# Patient Record
Sex: Female | Born: 1970 | Race: White | Hispanic: No | Marital: Single | Smoking: Never smoker
Health system: Southern US, Community
[De-identification: ages and names within clinical notes are randomized; demographics above are authoritative.]

## PROBLEM LIST (undated history)

## (undated) DIAGNOSIS — I10 Essential (primary) hypertension: Secondary | ICD-10-CM

## (undated) DIAGNOSIS — A4902 Methicillin resistant Staphylococcus aureus infection, unspecified site: Secondary | ICD-10-CM

## (undated) DIAGNOSIS — K219 Gastro-esophageal reflux disease without esophagitis: Secondary | ICD-10-CM

## (undated) HISTORY — PX: LAPAROSCOPIC GASTRIC SLEEVE RESECTION: SHX5895

## (undated) HISTORY — PX: CHOLECYSTECTOMY: SHX55

## (undated) HISTORY — PX: LEG SURGERY: SHX1003

## (undated) HISTORY — DX: Methicillin resistant Staphylococcus aureus infection, unspecified site: A49.02

## (undated) HISTORY — PX: FOOT SURGERY: SHX648

---

## 2002-12-08 ENCOUNTER — Emergency Department (HOSPITAL_COMMUNITY): Admission: EM | Admit: 2002-12-08 | Discharge: 2002-12-08 | Payer: Self-pay | Admitting: Emergency Medicine

## 2002-12-20 ENCOUNTER — Other Ambulatory Visit: Admission: RE | Admit: 2002-12-20 | Discharge: 2002-12-20 | Payer: Self-pay | Admitting: Obstetrics and Gynecology

## 2003-02-06 ENCOUNTER — Other Ambulatory Visit: Admission: RE | Admit: 2003-02-06 | Discharge: 2003-02-06 | Payer: Self-pay | Admitting: Obstetrics & Gynecology

## 2003-03-21 ENCOUNTER — Emergency Department (HOSPITAL_COMMUNITY): Admission: EM | Admit: 2003-03-21 | Discharge: 2003-03-21 | Payer: Self-pay | Admitting: Emergency Medicine

## 2003-05-23 ENCOUNTER — Ambulatory Visit (HOSPITAL_COMMUNITY): Admission: RE | Admit: 2003-05-23 | Discharge: 2003-05-23 | Payer: Self-pay | Admitting: Obstetrics and Gynecology

## 2003-07-09 ENCOUNTER — Ambulatory Visit (HOSPITAL_COMMUNITY): Admission: RE | Admit: 2003-07-09 | Discharge: 2003-07-09 | Payer: Self-pay | Admitting: Obstetrics & Gynecology

## 2003-11-07 ENCOUNTER — Inpatient Hospital Stay (HOSPITAL_COMMUNITY): Admission: AD | Admit: 2003-11-07 | Discharge: 2003-11-07 | Payer: Self-pay | Admitting: Obstetrics and Gynecology

## 2003-11-08 ENCOUNTER — Inpatient Hospital Stay (HOSPITAL_COMMUNITY): Admission: AD | Admit: 2003-11-08 | Discharge: 2003-11-11 | Payer: Self-pay | Admitting: Obstetrics & Gynecology

## 2003-11-08 ENCOUNTER — Encounter (INDEPENDENT_AMBULATORY_CARE_PROVIDER_SITE_OTHER): Payer: Self-pay | Admitting: *Deleted

## 2003-11-26 ENCOUNTER — Inpatient Hospital Stay (HOSPITAL_COMMUNITY): Admission: AD | Admit: 2003-11-26 | Discharge: 2003-11-26 | Payer: Self-pay | Admitting: Obstetrics & Gynecology

## 2003-12-19 ENCOUNTER — Other Ambulatory Visit: Admission: RE | Admit: 2003-12-19 | Discharge: 2003-12-19 | Payer: Self-pay | Admitting: Obstetrics and Gynecology

## 2005-03-25 ENCOUNTER — Emergency Department (HOSPITAL_COMMUNITY): Admission: EM | Admit: 2005-03-25 | Discharge: 2005-03-25 | Payer: Self-pay | Admitting: Emergency Medicine

## 2005-07-26 ENCOUNTER — Emergency Department (HOSPITAL_COMMUNITY): Admission: EM | Admit: 2005-07-26 | Discharge: 2005-07-26 | Payer: Self-pay | Admitting: Family Medicine

## 2007-05-18 ENCOUNTER — Encounter: Admission: RE | Admit: 2007-05-18 | Discharge: 2007-06-13 | Payer: Self-pay | Admitting: Podiatry

## 2007-08-20 ENCOUNTER — Encounter: Admission: RE | Admit: 2007-08-20 | Discharge: 2007-09-13 | Payer: Self-pay | Admitting: Podiatry

## 2008-01-08 ENCOUNTER — Ambulatory Visit (HOSPITAL_COMMUNITY): Admission: RE | Admit: 2008-01-08 | Discharge: 2008-01-08 | Payer: Self-pay | Admitting: Family Medicine

## 2009-05-23 ENCOUNTER — Emergency Department (HOSPITAL_COMMUNITY): Admission: EM | Admit: 2009-05-23 | Discharge: 2009-05-23 | Payer: Self-pay | Admitting: Emergency Medicine

## 2010-06-23 LAB — RAPID STREP SCREEN (MED CTR MEBANE ONLY): Streptococcus, Group A Screen (Direct): NEGATIVE

## 2010-07-01 ENCOUNTER — Emergency Department (HOSPITAL_COMMUNITY)
Admission: EM | Admit: 2010-07-01 | Discharge: 2010-07-01 | Disposition: A | Discharge status: Home / Self Care | Payer: Medicaid Other | Attending: Emergency Medicine | Admitting: Emergency Medicine

## 2010-07-01 DIAGNOSIS — R059 Cough, unspecified: Secondary | ICD-10-CM | POA: Insufficient documentation

## 2010-07-01 DIAGNOSIS — J029 Acute pharyngitis, unspecified: Secondary | ICD-10-CM | POA: Insufficient documentation

## 2010-07-01 DIAGNOSIS — R197 Diarrhea, unspecified: Secondary | ICD-10-CM | POA: Insufficient documentation

## 2010-07-01 DIAGNOSIS — R112 Nausea with vomiting, unspecified: Secondary | ICD-10-CM | POA: Insufficient documentation

## 2010-07-01 DIAGNOSIS — I10 Essential (primary) hypertension: Secondary | ICD-10-CM | POA: Insufficient documentation

## 2010-07-01 DIAGNOSIS — J069 Acute upper respiratory infection, unspecified: Secondary | ICD-10-CM | POA: Insufficient documentation

## 2010-07-01 DIAGNOSIS — R05 Cough: Secondary | ICD-10-CM | POA: Insufficient documentation

## 2010-07-01 DIAGNOSIS — R51 Headache: Secondary | ICD-10-CM | POA: Insufficient documentation

## 2010-08-20 NOTE — Op Note (Signed)
Connie Castaneda, Connie Castaneda                             ACCOUNT NO.:  [id number]   MEDICAL RECORD NO.:  [id number]                   PATIENT TYPE:  INP   LOCATION:  9124                                 FACILITY:  [facility]   PHYSICIAN:  Ilda Mori, M.D.                DATE OF BIRTH:  1971/03/05   DATE OF PROCEDURE:  11/08/2003  DATE OF DISCHARGE:                                 OPERATIVE REPORT   PREOPERATIVE DIAGNOSIS:  Nonreassuring fetal heart rate tracing, possible  placental abruption.   POSTOPERATIVE DIAGNOSIS:  Nonreassuring fetal heart rate tracing, possible  placental abruption.   OPERATION PERFORMED:  Primary transverse low cesarean section.   SURGEON:  Ilda Mori, M.D.   ASSISTANT:  Connie Castaneda, M.D.   ANESTHESIA:  Epidural.   ESTIMATED BLOOD LOSS:  1500 mL.   FINDINGS:  Female infant, birth weight 5 pounds 14 ounces, Apgar scores 7 and  9.  Arterial cord pH 7.25, bloody amniotic fluid consistent with abruption,  small clot at margin of placenta which was sent to pathology.   INDICATIONS FOR PROCEDURE:  The patient is a 40 year old gravida 2, para 0-1-  0-0 whose previous delivery was a vaginal delivery of an encephalic infant  at seven months.  The patient presented to triage with ruptured membranes in  early labor.  She was started on IV Pitocin after an epidural had been  placed and progressed very slowly throughout the day.  Her admission exam  was 5 cm by the nursing triage and this was at 7 o'clock a.m.  At 3 o'clock  p.m. her cervix was still 6 to 7 cm.  The patient developed a vaginal  bleeding which appeared to be greater than a bloody show.  Fetal heart tones  were reactive; however, there were persistent variables starting around 2  p.m. which continued for over an hour and these variables although they were  not severe were consistent with contractions and were atypical in the sense  that there was a slow recovery.  At approximately 4:30 to 5:00,  careful  evaluation of the situation revealed a nonreassuring fetal heart rate  tracing with very slow progress and no sense of immediate delivery and  persistent vaginal bleeding consistent with a small abruption.  It was felt  that with all these conditions, the safest thing at this point was to  proceed with cesarean section.  This was discussed with the patient who  agreed.   DESCRIPTION OF PROCEDURE:  The patient was taken to the operating room and  the epidural catheter that had been placed for labor analgesia was used for  surgical anesthesia.  The abdomen was prepped and draped in sterile fashion.  The bladder had previously been catheterized.  A low transverse incision was  made and carried down to the fascia which was extended transversely.  The  rectus muscle was then dissected free from  the overlying rectus fascia and  then divided in the midline and the peritoneal cavity was entered by blunt  dissection and extended bluntly.  The bladder plane was created sharply.  The bladder was displaced inferiorly digitally and then the lower segment  was then entered and the incision was extended bluntly.  The infant was  delivered with the aid of a vacuum extractor.  The placenta was then  delivered with manual extraction.  The uterus was bluntly curettaged.  The  lower segment was then closed with a single running interlocking layer of 0  Vicryl suture.  The bleeders along the edge of the closure were then  controlled with figure-of-eight and U sutures until hemostasis was obtained.  The bladder plane was not closed.  The fascia and rectus muscle was then  reapposed in the midline.  The fascia was closed with a running 0 Vicryl  suture and because of the patient's greater than 4 cm of adipose tissue, the  subcutaneous layer was closed with interrupted 2-0 plain catgut suture.  The  skin was then closed with staples.  The patient tolerated the procedure well  and left the operating room  in good condition.                                               Ilda Mori, M.D.    RK/MEDQ  D:  11/08/2003  T:  11/10/2003  Job:  [id number]

## 2010-08-20 NOTE — Discharge Summary (Signed)
NAMEJAQUIA, Connie Castaneda                             ACCOUNT NO.:  [id number]   MEDICAL RECORD NO.:  [id number]                   PATIENT TYPE:  INP   LOCATION:  9124                                 FACILITY:  [facility]   PHYSICIAN:  Miguel Aschoff, M.D.                    DATE OF BIRTH:  1970/04/29   DATE OF ADMISSION:  11/08/2003  DATE OF DISCHARGE:  11/11/2003                                 DISCHARGE SUMMARY   FINAL DIAGNOSES:  1.  Intrauterine pregnancy at term.  2.  Spontaneous rupture of membranes.  3.  Nonreassuring fetal heart tracing with possible placental abruption.   PROCEDURE:  Primary low transverse cesarean section.  Surgeon:  Dr. Ilda Mori.  Assistant:  Dr. Corky Sox.  Complications:  None.   This 39 year old G2 P0-1-0-0 presents at term with spontaneous rupture of  membranes and complaining of contractions.  The patient had a previous  delivery at 7 months with an encephalic infant.  The patient's antepartum  course with this pregnancy was also complicated by this history, of course,  of neural tube defect.  She was worked up by [employer] and did have a  normal amniocentesis performed with this pregnancy.  The patient also had a  positive group B strep culture and was started on antibiotics during labor.  Otherwise, the patient's antepartum course had been uncomplicated.  She was  admitted at this time, started on some IV Pitocin.  She progressed slowly  throughout the day.  On admission the patient's cervix was about 5 cm  dilated.  By about 3 p.m. that afternoon her cervix was only about 6-7 cm  dilated and she began to develop some vaginal bleeding which appeared to be  greater than a bloody show.  At this point fetal heart tones were reactive.  However, there were some persistent variables starting in mid afternoon  which continued for over an hour.  At about 4:30 p.m. that day, careful  evaluation of the situation revealed a nonreassuring fetal heart tracing  with slow progress and a decision was made to proceed with a cesarean  section.  The patient was taken to the operating room by Dr. Ilda Mori  where a primary low transverse cesarean section was performed with the  delivery of a 5-pound 14-ounce female infant with Apgars of 7 and 9.  There  was an arterial cord pH of 7.25 and bloody amniotic fluid consistent with  abruption, and a small clot at the margin of placenta was sent to pathology.  The patient's postoperative course was uncomplicated without any fevers.  She was felt ready for discharge on postoperative day #3.  She was sent home  on a regular diet, told to decrease activities, was given a prescription for  Tylox one q.4h. as needed for pain, told to continue her Chromagen iron  supplement, was to follow up  in the office in 4 weeks; of course, to call  with any increased problems, pain, or bleeding.   LABORATORY DATA ON DISCHARGE:  The patient had a hemoglobin of 9.4, white  blood cell count of 11.8.     Connie Castaneda, P.A.-C.                Miguel Aschoff, M.D.    MB/MEDQ  D:  12/15/2003  T:  12/15/2003  Job:  [id number]

## 2010-10-27 ENCOUNTER — Other Ambulatory Visit (HOSPITAL_COMMUNITY): Payer: Self-pay | Admitting: Family Medicine

## 2010-10-27 DIAGNOSIS — Z1231 Encounter for screening mammogram for malignant neoplasm of breast: Secondary | ICD-10-CM

## 2010-11-25 ENCOUNTER — Emergency Department (HOSPITAL_COMMUNITY)
Admission: EM | Admit: 2010-11-25 | Discharge: 2010-11-26 | Disposition: A | Discharge status: Home / Self Care | Payer: Medicaid Other | Attending: Emergency Medicine | Admitting: Emergency Medicine

## 2010-11-25 DIAGNOSIS — T280XXA Burn of mouth and pharynx, initial encounter: Secondary | ICD-10-CM | POA: Insufficient documentation

## 2010-11-25 DIAGNOSIS — A499 Bacterial infection, unspecified: Secondary | ICD-10-CM | POA: Insufficient documentation

## 2010-11-25 DIAGNOSIS — X12XXXA Contact with other hot fluids, initial encounter: Secondary | ICD-10-CM | POA: Insufficient documentation

## 2010-11-25 DIAGNOSIS — B9689 Other specified bacterial agents as the cause of diseases classified elsewhere: Secondary | ICD-10-CM | POA: Insufficient documentation

## 2010-11-25 DIAGNOSIS — N760 Acute vaginitis: Secondary | ICD-10-CM | POA: Insufficient documentation

## 2010-11-25 DIAGNOSIS — I10 Essential (primary) hypertension: Secondary | ICD-10-CM | POA: Insufficient documentation

## 2010-11-26 LAB — URINALYSIS, ROUTINE W REFLEX MICROSCOPIC
Bilirubin Urine: NEGATIVE
Glucose, UA: NEGATIVE mg/dL
Hgb urine dipstick: NEGATIVE
Ketones, ur: NEGATIVE mg/dL
Leukocytes, UA: NEGATIVE
Nitrite: NEGATIVE
Protein, ur: NEGATIVE mg/dL
Specific Gravity, Urine: 1.022 (ref 1.005–1.030)
Urobilinogen, UA: 0.2 mg/dL (ref 0.0–1.0)
pH: 6 (ref 5.0–8.0)

## 2010-11-26 LAB — WET PREP, GENITAL
Trich, Wet Prep: NONE SEEN
Yeast Wet Prep HPF POC: NONE SEEN

## 2010-11-26 LAB — POCT PREGNANCY, URINE: Preg Test, Ur: NEGATIVE

## 2010-11-29 ENCOUNTER — Ambulatory Visit (HOSPITAL_COMMUNITY): Payer: Medicaid Other | Attending: Family Medicine

## 2010-11-30 LAB — GC/CHLAMYDIA PROBE AMP, GENITAL
Chlamydia, DNA Probe: UNDETERMINED
GC Probe Amp, Genital: NEGATIVE

## 2011-01-05 ENCOUNTER — Emergency Department (HOSPITAL_COMMUNITY)
Admission: EM | Admit: 2011-01-05 | Discharge: 2011-01-05 | Disposition: A | Discharge status: Home / Self Care | Payer: Medicaid Other | Attending: Emergency Medicine | Admitting: Emergency Medicine

## 2011-01-05 DIAGNOSIS — J020 Streptococcal pharyngitis: Secondary | ICD-10-CM | POA: Insufficient documentation

## 2011-01-05 DIAGNOSIS — I10 Essential (primary) hypertension: Secondary | ICD-10-CM | POA: Insufficient documentation

## 2011-01-05 DIAGNOSIS — J351 Hypertrophy of tonsils: Secondary | ICD-10-CM | POA: Insufficient documentation

## 2011-01-09 ENCOUNTER — Emergency Department (HOSPITAL_COMMUNITY)
Admission: EM | Admit: 2011-01-09 | Discharge: 2011-01-09 | Disposition: A | Discharge status: Home / Self Care | Payer: Medicaid Other | Attending: Emergency Medicine | Admitting: Emergency Medicine

## 2011-01-09 DIAGNOSIS — R509 Fever, unspecified: Secondary | ICD-10-CM | POA: Insufficient documentation

## 2011-01-09 DIAGNOSIS — J029 Acute pharyngitis, unspecified: Secondary | ICD-10-CM | POA: Insufficient documentation

## 2011-01-09 DIAGNOSIS — B373 Candidiasis of vulva and vagina: Secondary | ICD-10-CM | POA: Insufficient documentation

## 2011-01-09 DIAGNOSIS — R110 Nausea: Secondary | ICD-10-CM | POA: Insufficient documentation

## 2011-01-09 DIAGNOSIS — B3731 Acute candidiasis of vulva and vagina: Secondary | ICD-10-CM | POA: Insufficient documentation

## 2011-01-09 DIAGNOSIS — I10 Essential (primary) hypertension: Secondary | ICD-10-CM | POA: Insufficient documentation

## 2012-11-07 ENCOUNTER — Other Ambulatory Visit: Payer: Self-pay | Admitting: Family Medicine

## 2012-11-07 DIAGNOSIS — Z1231 Encounter for screening mammogram for malignant neoplasm of breast: Secondary | ICD-10-CM

## 2012-11-20 ENCOUNTER — Ambulatory Visit
Admission: RE | Admit: 2012-11-20 | Discharge: 2012-11-20 | Disposition: A | Discharge status: Home / Self Care | Payer: Medicaid Other | Source: Ambulatory Visit | Attending: Family Medicine | Admitting: Family Medicine

## 2012-11-20 DIAGNOSIS — Z1231 Encounter for screening mammogram for malignant neoplasm of breast: Secondary | ICD-10-CM

## 2013-07-08 ENCOUNTER — Emergency Department (HOSPITAL_COMMUNITY)
Admission: EM | Admit: 2013-07-08 | Discharge: 2013-07-08 | Disposition: A | Discharge status: Home / Self Care | Payer: Medicaid Other | Source: Home / Self Care | Attending: Emergency Medicine | Admitting: Emergency Medicine

## 2013-07-08 ENCOUNTER — Encounter (HOSPITAL_COMMUNITY): Payer: Self-pay | Admitting: Emergency Medicine

## 2013-07-08 ENCOUNTER — Emergency Department (INDEPENDENT_AMBULATORY_CARE_PROVIDER_SITE_OTHER): Payer: Medicaid Other

## 2013-07-08 DIAGNOSIS — J40 Bronchitis, not specified as acute or chronic: Secondary | ICD-10-CM

## 2013-07-08 HISTORY — DX: Essential (primary) hypertension: I10

## 2013-07-08 MED ORDER — GUAIFENESIN-CODEINE 100-10 MG/5ML PO SOLN: 10.0000 mL | ORAL | Status: DC | PRN

## 2013-07-08 MED ORDER — IPRATROPIUM BROMIDE 0.02 % IN SOLN
RESPIRATORY_TRACT | Status: AC
  Filled 2013-07-08: qty 2.5

## 2013-07-08 MED ORDER — ALBUTEROL SULFATE HFA 108 (90 BASE) MCG/ACT IN AERS: 2.0000 | INHALATION_SPRAY | RESPIRATORY_TRACT | Status: DC | PRN

## 2013-07-08 MED ORDER — ALBUTEROL SULFATE (2.5 MG/3ML) 0.083% IN NEBU
INHALATION_SOLUTION | RESPIRATORY_TRACT | Status: AC
  Filled 2013-07-08: qty 3

## 2013-07-08 MED ORDER — AEROCHAMBER PLUS FLO-VU LARGE MISC: 1.0000 | Freq: Once | Status: DC

## 2013-07-08 MED ORDER — IPRATROPIUM-ALBUTEROL 0.5-2.5 (3) MG/3ML IN SOLN
3.0000 mL | Freq: Once | RESPIRATORY_TRACT | Status: AC
  Administered 2013-07-08: 3 mL via RESPIRATORY_TRACT

## 2013-07-08 MED ORDER — METHYLPREDNISOLONE 4 MG PO KIT: PACK | ORAL | Status: DC

## 2013-07-08 NOTE — ED Notes (Signed)
C/o   Nonproductive cough with chest congestion.  Chest soreness.  Sweats.  Chills.  Fever.  And nausea.  On set Thursday.  No relief with otc meds.

## 2013-07-08 NOTE — ED Provider Notes (Signed)
CSN: [id number]     Arrival date & time 07/08/13  4098 History   None    Chief Complaint  Patient presents with  . URI   (Consider location/radiation/quality/duration/timing/severity/associated sxs/prior Treatment) HPI Comments: 43 year old female presents complaining of being sick for 5 days. She has cough, body aches, fever, chills, nausea, sinus pressure, nasal congestion, rhinorrhea. Her symptoms began with just a mild cough. All the other symptoms have evolved since then and have gotten progressively worse. The body aches started yesterday and the nausea started this morning, although she did not actually vomit. She is no longer feeling nauseous. She has been taking over-the-counter medications with very minimal relief of her symptoms, she feels like she just continues to get worse.  She denies chest pain, shortness of breath, pleuritic chest pain, abdominal pain, sore throat. No recent travel or sick contacts.   Patient is a 43 y.o. female presenting with URI.  URI Presenting symptoms: congestion, cough, fatigue, fever and rhinorrhea   Presenting symptoms: no sore throat   Associated symptoms: myalgias   Associated symptoms: no wheezing     Past Medical History  Diagnosis Date  . Hypertension    Past Surgical History  Procedure Laterality Date  . Cholecystectomy    . Foot surgery    . Cesarean section     History reviewed. No pertinent family history. History  Substance Use Topics  . Smoking status: Never Smoker   . Smokeless tobacco: Not on file  . Alcohol Use: No   OB History   Grav Para Term Preterm Abortions TAB SAB Ect Mult Living                 Review of Systems  Constitutional: Positive for fever, chills and fatigue.  HENT: Positive for congestion, rhinorrhea and sinus pressure. Negative for sore throat.   Respiratory: Positive for cough and chest tightness. Negative for shortness of breath and wheezing.   Cardiovascular: Negative for chest pain and leg  swelling.  Gastrointestinal: Positive for nausea. Negative for vomiting, abdominal pain and diarrhea.  Musculoskeletal: Positive for myalgias.    Allergies  Review of patient's allergies indicates no known allergies.  Home Medications   Current Outpatient Rx  Name  Route  Sig  Dispense  Refill  . amLODipine-benazepril (LOTREL) 10-40 MG per capsule   Oral   Take 1 capsule by mouth daily.         Marland Kitchen omeprazole (PRILOSEC) 40 MG capsule   Oral   Take 40 mg by mouth daily.         . sucralfate (CARAFATE) 1 G tablet   Oral   Take 1 g by mouth 4 (four) times daily -  with meals and at bedtime.         Marland Kitchen albuterol (PROVENTIL HFA;VENTOLIN HFA) 108 (90 BASE) MCG/ACT inhaler   Inhalation   Inhale 2 puffs into the lungs every 4 (four) hours as needed for wheezing.   1 Inhaler   0   . guaiFENesin-codeine 100-10 MG/5ML syrup   Oral   Take 10 mLs by mouth every 4 (four) hours as needed for cough.   180 mL   0   . methylPREDNISolone (MEDROL DOSEPAK) 4 MG tablet      Use as directed on package instructions   21 tablet   0   . Spacer/Aero-Holding Chambers (AEROCHAMBER PLUS FLO-VU LARGE) MISC   Other   1 each by Other route once.   1 each   0  BP 148/62  Pulse 112  Temp(Src) 98.1 F (36.7 C) (Oral)  Resp 20  SpO2 100%  LMP 07/04/2013 Physical Exam  Nursing note and vitals reviewed. Constitutional: She is oriented to person, place, and time. Vital signs are normal. She appears well-developed and well-nourished. No distress.  HENT:  Head: Normocephalic and atraumatic.  Right Ear: External ear normal.  Left Ear: External ear normal.  Nose: Nose normal.  Mouth/Throat: Oropharynx is clear and moist. No oropharyngeal exudate.  Eyes: Conjunctivae are normal. Right eye exhibits no discharge. Left eye exhibits no discharge.  Neck: Normal range of motion. Neck supple.  Cardiovascular: Regular rhythm, normal heart sounds and normal pulses.  Tachycardia present.    Pulmonary/Chest: Effort normal. No accessory muscle usage. Not tachypneic. No respiratory distress. She has no decreased breath sounds. She has wheezes in the left upper field. She has no rhonchi. She has no rales.  Lymphadenopathy:    She has no cervical adenopathy.  Neurological: She is alert and oriented to person, place, and time. She has normal strength. Coordination normal.  Skin: Skin is warm and dry. No rash noted. She is not diaphoretic.  Psychiatric: She has a normal mood and affect. Judgment normal.    ED Course  Procedures (including critical care time) Labs Review Labs Reviewed - No data to display Imaging Review Dg Chest 2 View  07/08/2013   CLINICAL DATA:  Cough, chills and body aches for 5 days.  EXAM: CHEST  2 VIEW  COMPARISON:  None.  FINDINGS: Lungs clear. Heart size normal. No pneumothorax or pleural effusion.  IMPRESSION: Negative chest.   Electronically Signed   By: Drusilla Kannerhomas  Dalessio M.D.   On: 07/08/2013 11:54     MDM   1. Bronchitis    Chest x-ray normal. Improved symptomatically and to auscultation with DuoNeb. Treating with cough suppressant, Medrol Dosepak, albuterol inhaler. Followup if no improvement within 4-5 days or if worsening.   New Prescriptions   ALBUTEROL (PROVENTIL HFA;VENTOLIN HFA) 108 (90 BASE) MCG/ACT INHALER    Inhale 2 puffs into the lungs every 4 (four) hours as needed for wheezing.   GUAIFENESIN-CODEINE 100-10 MG/5ML SYRUP    Take 10 mLs by mouth every 4 (four) hours as needed for cough.   METHYLPREDNISOLONE (MEDROL DOSEPAK) 4 MG TABLET    Use as directed on package instructions   SPACER/AERO-HOLDING CHAMBERS (AEROCHAMBER PLUS FLO-VU LARGE) MISC    1 each by Other route once.       Graylon GoodZachary H Cyanne Delmar, PA-C 07/08/13 1226

## 2013-07-08 NOTE — Discharge Instructions (Signed)

## 2013-07-08 NOTE — ED Provider Notes (Signed)
Medical screening examination/treatment/procedure(s) were performed by non-physician practitioner and as supervising physician I was immediately available for consultation/collaboration.  Leslee Homeavid Drystan Reader, M.D.  Reuben Likesavid C Kassim Guertin, MD 07/08/13 [phone]

## 2013-07-13 ENCOUNTER — Encounter (HOSPITAL_COMMUNITY): Payer: Self-pay | Admitting: Emergency Medicine

## 2013-07-13 ENCOUNTER — Emergency Department (INDEPENDENT_AMBULATORY_CARE_PROVIDER_SITE_OTHER)
Admission: EM | Admit: 2013-07-13 | Discharge: 2013-07-13 | Disposition: A | Discharge status: Home / Self Care | Payer: Medicaid Other | Source: Home / Self Care | Attending: Family Medicine | Admitting: Family Medicine

## 2013-07-13 DIAGNOSIS — R1013 Epigastric pain: Secondary | ICD-10-CM

## 2013-07-13 NOTE — Discharge Instructions (Signed)
Thank you for coming in today. Resume your  Sucralfate and omeprazole.  Followup with your primary care Dr. Soon If not better please go to the emergency room or return.  If you get dramatically worse go to the emergency room If your belly pain worsens, or you have high fever, bad vomiting, blood in your stool or black tarry stool go to the Emergency Room.   Abdominal Pain, Women Abdominal (stomach, pelvic, or belly) pain can be caused by many things. It is important to tell your doctor:  The location of the pain.  Does it come and go or is it present all the time?  Are there things that start the pain (eating certain foods, exercise)?  Are there other symptoms associated with the pain (fever, nausea, vomiting, diarrhea)? All of this is helpful to know when trying to find the cause of the pain. CAUSES   Stomach: virus or bacteria infection, or ulcer.  Intestine: appendicitis (inflamed appendix), regional ileitis (Crohn's disease), ulcerative colitis (inflamed colon), irritable bowel syndrome, diverticulitis (inflamed diverticulum of the colon), or cancer of the stomach or intestine.  Gallbladder disease or stones in the gallbladder.  Kidney disease, kidney stones, or infection.  Pancreas infection or cancer.  Fibromyalgia (pain disorder).  Diseases of the female organs:  Uterus: fibroid (non-cancerous) tumors or infection.  Fallopian tubes: infection or tubal pregnancy.  Ovary: cysts or tumors.  Pelvic adhesions (scar tissue).  Endometriosis (uterus lining tissue growing in the pelvis and on the pelvic organs).  Pelvic congestion syndrome (female organs filling up with blood just before the menstrual period).  Pain with the menstrual period.  Pain with ovulation (producing an egg).  Pain with an IUD (intrauterine device, birth control) in the uterus.  Cancer of the female organs.  Functional pain (pain not caused by a disease, may improve without  treatment).  Psychological pain.  Depression. DIAGNOSIS  Your doctor will decide the seriousness of your pain by doing an examination.  Blood tests.  X-rays.  Ultrasound.  CT scan (computed tomography, special type of X-ray).  MRI (magnetic resonance imaging).  Cultures, for infection.  Barium enema (dye inserted in the large intestine, to better view it with X-rays).  Colonoscopy (looking in intestine with a lighted tube).  Laparoscopy (minor surgery, looking in abdomen with a lighted tube).  Major abdominal exploratory surgery (looking in abdomen with a large incision). TREATMENT  The treatment will depend on the cause of the pain.   Many cases can be observed and treated at home.  Over-the-counter medicines recommended by your caregiver.  Prescription medicine.  Antibiotics, for infection.  Birth control pills, for painful periods or for ovulation pain.  Hormone treatment, for endometriosis.  Nerve blocking injections.  Physical therapy.  Antidepressants.  Counseling with a psychologist or psychiatrist.  Minor or major surgery. HOME CARE INSTRUCTIONS   Do not take laxatives, unless directed by your caregiver.  Take over-the-counter pain medicine only if ordered by your caregiver. Do not take aspirin because it can cause an upset stomach or bleeding.  Try a clear liquid diet (broth or water) as ordered by your caregiver. Slowly move to a bland diet, as tolerated, if the pain is related to the stomach or intestine.  Have a thermometer and take your temperature several times a day, and record it.  Bed rest and sleep, if it helps the pain.  Avoid sexual intercourse, if it causes pain.  Avoid stressful situations.  Keep your follow-up appointments and tests, as your  caregiver orders.  If the pain does not go away with medicine or surgery, you may try:  Acupuncture.  Relaxation exercises (yoga, meditation).  Group therapy.  Counseling. SEEK  MEDICAL CARE IF:   You notice certain foods cause stomach pain.  Your home care treatment is not helping your pain.  You need stronger pain medicine.  You want your IUD removed.  You feel faint or lightheaded.  You develop nausea and vomiting.  You develop a rash.  You are having side effects or an allergy to your medicine. SEEK IMMEDIATE MEDICAL CARE IF:   Your pain does not go away or gets worse.  You have a fever.  Your pain is felt only in portions of the abdomen. The right side could possibly be appendicitis. The left lower portion of the abdomen could be colitis or diverticulitis.  You are passing blood in your stools (bright red or black tarry stools, with or without vomiting).  You have blood in your urine.  You develop chills, with or without a fever.  You pass out. MAKE SURE YOU:   Understand these instructions.  Will watch your condition.  Will get help right away if you are not doing well or get worse. Document Released: 01/16/2007 Document Revised: 06/13/2011 Document Reviewed: 02/05/2009 [facility] Patient Information 2014 [place], [place].

## 2013-07-13 NOTE — ED Notes (Addendum)
Pt  Reports  Upper  abd  Pain     That  She  Reports  She  Has  Had  fot  Quite  A  While  That  Became  Worse  Today   denys  Any  Vomiting  But  Reports  Some  Nausea   Present  Pt  Reports  Has  Been  Seen in past  For  chostochondritis    Pt  Seen  ucc  5  Days  Ago

## 2013-07-13 NOTE — ED Provider Notes (Signed)
Connie Castaneda is a 43 y.o. female who presents to Urgent Care today for epigastric abdominal pain. Patient has a history of epigastric abdominal pain diagnosed as a stomach ulcer by her primary care provider. She was treated with Carafate and omeprazole which worked well. She was seen 5 days ago for bronchitis and given prednisone. She stopped taking but the Carafate and omeprazole during treatment of her bronchitis. Her symptoms worsened over the last several days. She denies any vomiting or diarrhea. The pain is worse today but improved with ibuprofen. She denies any blood in her stool or black her stool. She feels well otherwise. Pain is moderate.   Past Medical History  Diagnosis Date  . Hypertension    History  Substance Use Topics  . Smoking status: Never Smoker   . Smokeless tobacco: Not on file  . Alcohol Use: No   ROS as above Medications: No current facility-administered medications for this encounter.   Current Outpatient Prescriptions  Medication Sig Dispense Refill  . albuterol (PROVENTIL HFA;VENTOLIN HFA) 108 (90 BASE) MCG/ACT inhaler Inhale 2 puffs into the lungs every 4 (four) hours as needed for wheezing.  1 Inhaler  0  . amLODipine-benazepril (LOTREL) 10-40 MG per capsule Take 1 capsule by mouth daily.      Marland Kitchen. guaiFENesin-codeine 100-10 MG/5ML syrup Take 10 mLs by mouth every 4 (four) hours as needed for cough.  180 mL  0  . methylPREDNISolone (MEDROL DOSEPAK) 4 MG tablet Use as directed on package instructions  21 tablet  0  . omeprazole (PRILOSEC) 40 MG capsule Take 40 mg by mouth daily.      Marland Kitchen. Spacer/Aero-Holding Chambers (AEROCHAMBER PLUS FLO-VU LARGE) MISC 1 each by Other route once.  1 each  0  . sucralfate (CARAFATE) 1 G tablet Take 1 g by mouth 4 (four) times daily -  with meals and at bedtime.        Exam:  BP 154/94  Pulse 103  Temp(Src) 97.9 F (36.6 C) (Oral)  Resp 22  SpO2 97%  LMP 07/04/2013 Gen: Well NAD HEENT: EOMI,  MMM Lungs: Normal work of  breathing. CTABL Heart: RRR no MRG Abd: NABS, Soft. NT, ND Exts: Brisk capillary refill, warm and well perfused.   No results found for this or any previous visit (from the past 24 hour(s)). No results found.  Assessment and Plan: 43 y.o. female with probable gastritis. Patient does not have much pain currently. She stopped taking the medication she was taking for gastritis in her symptoms worsened. Plan to restart with Carafate and omeprazole. Followup with primary care provider. Not improving recommend followup with gastroenterology. Present to ER if worsening.   Discussed warning signs or symptoms. Please see discharge instructions. Patient expresses understanding.    Rodolph BongEvan S Mayla Biddy, MD 07/13/13 2012

## 2013-07-16 ENCOUNTER — Encounter (HOSPITAL_COMMUNITY): Payer: Self-pay | Admitting: Emergency Medicine

## 2013-07-16 ENCOUNTER — Emergency Department (HOSPITAL_COMMUNITY)
Admission: EM | Admit: 2013-07-16 | Discharge: 2013-07-16 | Disposition: A | Discharge status: Home / Self Care | Payer: Medicaid Other | Attending: Emergency Medicine | Admitting: Emergency Medicine

## 2013-07-16 ENCOUNTER — Emergency Department (HOSPITAL_COMMUNITY): Payer: Medicaid Other

## 2013-07-16 DIAGNOSIS — R059 Cough, unspecified: Secondary | ICD-10-CM | POA: Insufficient documentation

## 2013-07-16 DIAGNOSIS — R109 Unspecified abdominal pain: Secondary | ICD-10-CM

## 2013-07-16 DIAGNOSIS — Z79899 Other long term (current) drug therapy: Secondary | ICD-10-CM | POA: Insufficient documentation

## 2013-07-16 DIAGNOSIS — R05 Cough: Secondary | ICD-10-CM | POA: Insufficient documentation

## 2013-07-16 DIAGNOSIS — R000 Tachycardia, unspecified: Secondary | ICD-10-CM | POA: Insufficient documentation

## 2013-07-16 DIAGNOSIS — Z3202 Encounter for pregnancy test, result negative: Secondary | ICD-10-CM | POA: Insufficient documentation

## 2013-07-16 DIAGNOSIS — I10 Essential (primary) hypertension: Secondary | ICD-10-CM | POA: Insufficient documentation

## 2013-07-16 DIAGNOSIS — K219 Gastro-esophageal reflux disease without esophagitis: Secondary | ICD-10-CM | POA: Insufficient documentation

## 2013-07-16 HISTORY — DX: Gastro-esophageal reflux disease without esophagitis: K21.9

## 2013-07-16 LAB — COMPREHENSIVE METABOLIC PANEL
ALT: 17 U/L (ref 0–35)
AST: 19 U/L (ref 0–37)
Albumin: 3.3 g/dL — ABNORMAL LOW (ref 3.5–5.2)
Alkaline Phosphatase: 67 U/L (ref 39–117)
BUN: 14 mg/dL (ref 6–23)
CO2: 27 mEq/L (ref 19–32)
Calcium: 9.4 mg/dL (ref 8.4–10.5)
Chloride: 98 mEq/L (ref 96–112)
Creatinine, Ser: 0.71 mg/dL (ref 0.50–1.10)
GFR calc Af Amer: >90 mL/min (ref >90)
GFR calc non Af Amer: >90 mL/min (ref >90)
Glucose, Bld: 103 mg/dL — ABNORMAL HIGH (ref 70–99)
Potassium: 4 mEq/L (ref 3.7–5.3)
Sodium: 137 mEq/L (ref 137–147)
Total Bilirubin: 0.3 mg/dL (ref 0.3–1.2)
Total Protein: 7.7 g/dL (ref 6.0–8.3)

## 2013-07-16 LAB — URINALYSIS, ROUTINE W REFLEX MICROSCOPIC
Bilirubin Urine: NEGATIVE
Glucose, UA: NEGATIVE mg/dL
Hgb urine dipstick: NEGATIVE
Ketones, ur: NEGATIVE mg/dL
Leukocytes, UA: NEGATIVE
Nitrite: NEGATIVE
Protein, ur: NEGATIVE mg/dL
Specific Gravity, Urine: 1.02 (ref 1.005–1.030)
Urobilinogen, UA: 0.2 mg/dL (ref 0.0–1.0)
pH: 6 (ref 5.0–8.0)

## 2013-07-16 LAB — CBC
HCT: 38.4 % (ref 36.0–46.0)
Hemoglobin: 12.6 g/dL (ref 12.0–15.0)
MCH: 28.5 pg (ref 26.0–34.0)
MCHC: 32.8 g/dL (ref 30.0–36.0)
MCV: 86.9 fL (ref 78.0–100.0)
Platelets: 352 10*3/uL (ref 150–400)
RBC: 4.42 MIL/uL (ref 3.87–5.11)
RDW: 12.3 % (ref 11.5–15.5)
WBC: 9.5 10*3/uL (ref 4.0–10.5)

## 2013-07-16 LAB — LIPASE, BLOOD: Lipase: 37 U/L (ref 11–59)

## 2013-07-16 LAB — D-DIMER, QUANTITATIVE: D-Dimer, Quant: <0.27 ug/mL-FEU (ref 0.00–0.48)

## 2013-07-16 LAB — PREGNANCY, URINE: Preg Test, Ur: NEGATIVE

## 2013-07-16 MED ORDER — TRAMADOL HCL 50 MG PO TABS: 50.0000 mg | ORAL_TABLET | Freq: Four times a day (QID) | ORAL | Status: DC | PRN

## 2013-07-16 MED ORDER — KETOROLAC TROMETHAMINE 30 MG/ML IJ SOLN
30.0000 mg | Freq: Once | INTRAMUSCULAR | Status: AC
  Administered 2013-07-16: 30 mg via INTRAMUSCULAR
  Filled 2013-07-16: qty 1

## 2013-07-16 NOTE — Discharge Instructions (Signed)
Take motrin or aleve as need for pain. You may also take ultram as need for pain - no driving if/when taking ultram. Follow up with primary care doctor in 1 week if symptoms fail to improve/resolve. Return to ER if worse, new symptoms, fevers, trouble breathing, severe abdominal pain, persistent vomiting, other concern.     Abdominal Pain, Adult Many things can cause abdominal pain. Usually, abdominal pain is not caused by a disease and will improve without treatment. It can often be observed and treated at home. Your health care provider will do a physical exam and possibly order blood tests and X-rays to help determine the seriousness of your pain. However, in many cases, more time must pass before a clear cause of the pain can be found. Before that point, your health care provider may not know if you need more testing or further treatment. HOME CARE INSTRUCTIONS  Monitor your abdominal pain for any changes. The following actions may help to alleviate any discomfort you are experiencing:  Only take over-the-counter or prescription medicines as directed by your health care provider.  Do not take laxatives unless directed to do so by your health care provider.  Try a clear liquid diet (broth, tea, or water) as directed by your health care provider. Slowly move to a bland diet as tolerated. SEEK MEDICAL CARE IF:  You have unexplained abdominal pain.  You have abdominal pain associated with nausea or diarrhea.  You have pain when you urinate or have a bowel movement.  You experience abdominal pain that wakes you in the night.  You have abdominal pain that is worsened or improved by eating food.  You have abdominal pain that is worsened with eating fatty foods. SEEK IMMEDIATE MEDICAL CARE IF:   Your pain does not go away within 2 hours.  You have a fever.  You keep throwing up (vomiting).  Your pain is felt only in portions of the abdomen, such as the right side or the left lower  portion of the abdomen.  You pass bloody or black tarry stools. MAKE SURE YOU:  Understand these instructions.   Will watch your condition.   Will get help right away if you are not doing well or get worse.  Document Released: 12/29/2004 Document Revised: 01/09/2013 Document Reviewed: 11/28/2012 [facility] Patient Information 2014 [place], [place].     Flank Pain Flank pain refers to pain that is located on the side of the body between the upper abdomen and the back. The pain may occur over a short period of time (acute) or may be long-term or reoccurring (chronic). It may be mild or severe. Flank pain can be caused by many things. CAUSES  Some of the more common causes of flank pain include:  Muscle strains.   Muscle spasms.   A disease of your spine (vertebral disk disease).   A lung infection (pneumonia).   Fluid around your lungs (pulmonary edema).   A kidney infection.   Kidney stones.   A very painful skin rash caused by the chickenpox virus (shingles).   Gallbladder disease.  HOME CARE INSTRUCTIONS  Home care will depend on the cause of your pain. In general,  Rest as directed by your caregiver.  Drink enough fluids to keep your urine clear or pale yellow.  Only take over-the-counter or prescription medicines as directed by your caregiver. Some medicines may help relieve the pain.  Tell your caregiver about any changes in your pain.  Follow up with your caregiver as  directed. SEEK IMMEDIATE MEDICAL CARE IF:   Your pain is not controlled with medicine.   You have new or worsening symptoms.  Your pain increases.   You have abdominal pain.   You have shortness of breath.   You have persistent nausea or vomiting.   You have swelling in your abdomen.   You feel faint or pass out.   You have blood in your urine.  You have a fever or persistent symptoms for more than 2 3 days.  You have a fever and your symptoms suddenly get  worse. MAKE SURE YOU:   Understand these instructions.  Will watch your condition.  Will get help right away if you are not doing well or get worse. Document Released: 05/12/2005 Document Revised: 12/14/2011 Document Reviewed: 11/03/2011 [facility]Exit[facility] Patient Information 2014 [place], [place].    Cough, Adult  A cough is a reflex that helps clear your throat and airways. It can help heal the body or may be a reaction to an irritated airway. A cough may only last 2 or 3 weeks (acute) or may last more than 8 weeks (chronic).  CAUSES Acute cough:  Viral or bacterial infections. Chronic cough:  Infections.  Allergies.  Asthma.  Post-nasal drip.  Smoking.  Heartburn or acid reflux.  Some medicines.  Chronic lung problems (COPD).  Cancer. SYMPTOMS   Cough.  Fever.  Chest pain.  Increased breathing rate.  High-pitched whistling sound when breathing (wheezing).  Colored mucus that you cough up (sputum). TREATMENT   A bacterial cough may be treated with antibiotic medicine.  A viral cough must run its course and will not respond to antibiotics.  Your caregiver may recommend other treatments if you have a chronic cough. HOME CARE INSTRUCTIONS   Only take over-the-counter or prescription medicines for pain, discomfort, or fever as directed by your caregiver. Use cough suppressants only as directed by your caregiver.  Use a cold steam vaporizer or humidifier in your bedroom or home to help loosen secretions.  Sleep in a semi-upright position if your cough is worse at night.  Rest as needed.  Stop smoking if you smoke. SEEK IMMEDIATE MEDICAL CARE IF:   You have pus in your sputum.  Your cough starts to worsen.  You cannot control your cough with suppressants and are losing sleep.  You begin coughing up blood.  You have difficulty breathing.  You develop pain which is getting worse or is uncontrolled with medicine.  You have a fever. MAKE SURE YOU:    Understand these instructions.  Will watch your condition.  Will get help right away if you are not doing well or get worse. Document Released: 09/17/2010 Document Revised: 06/13/2011 Document Reviewed: 09/17/2010 [facility]Exit[facility] Patient Information 2014 [place], [place].

## 2013-07-16 NOTE — ED Notes (Signed)
MD at bedside. 

## 2013-07-16 NOTE — ED Notes (Signed)
Patient transported to X-ray 

## 2013-07-16 NOTE — ED Notes (Addendum)
Pt c/o of burning epigastric sensation since Sunday. Ongoing Since March with some relief. Seen PCP and has RX for this pain but is not controlling pain. C/o of nausea without vomiting denies fever

## 2013-07-16 NOTE — ED Provider Notes (Signed)
CSN: [id number]     Arrival date & time 07/16/13  [id number] History   First MD Initiated Contact with Patient 07/16/13 [phone]     Chief Complaint  Patient presents with  . Abdominal Pain     (Consider location/radiation/quality/duration/timing/severity/associated sxs/prior Treatment) Patient is a 43 y.o. female presenting with abdominal pain. The history is provided by the patient.  Abdominal Pain Associated symptoms: cough   Associated symptoms: no chest pain, no chills, no constipation, no diarrhea, no dysuria, no fever, no hematuria, no nausea, no shortness of breath, no sore throat and no vomiting   pt c/o right upper abdominal pain for the past few weeks, constant, dull to sharp, mod-severe. No consistent exacerbating or alleviating factors but occasionally worse w certain positional changes, movements, walking, cough, and deep breath. No hx same pain. Remote hx cholecystectomy. No nv. No fever or chills. No abd distension, having normal bms. No mid to lower abd pain. No dysuria or hematuria. No trauma or fall. Not working, denies any heavy lifting, bending or strain to area. States saw pcp last week for same, no definite dx. No immobility, trauma, recent surgery, tob use, ocp,  or prolonged travel. No leg pain or swelling. No hx dvt or pe. 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 Has had recent non prod cough, but improved from prior. No sore throat/runny nose or other uri c/o.     Past Medical History  Diagnosis Date  . Hypertension   . GERD (gastroesophageal reflux disease)    Past Surgical History  Procedure Laterality Date  . Cholecystectomy    . Foot surgery    . Cesarean section     No family history on file. History  Substance Use Topics  . Smoking status: Never Smoker   . Smokeless tobacco: Not on file  . Alcohol Use: No   OB History   Grav Para Term Preterm Abortions TAB SAB Ect Mult Living                 Review of Systems  Constitutional:  Negative for fever and chills.  HENT: Negative for sore throat.   Eyes: Negative for redness.  Respiratory: Positive for cough. Negative for shortness of breath.   Cardiovascular: Negative for chest pain and leg swelling.  Gastrointestinal: Positive for abdominal pain. Negative for nausea, vomiting, diarrhea, constipation and abdominal distention.  Genitourinary: Negative for dysuria, hematuria and flank pain.  Musculoskeletal: Negative for back pain and neck pain.  Skin: Negative for rash.  Neurological: Negative for headaches.  Hematological: Does not bruise/bleed easily.  Psychiatric/Behavioral: Negative for confusion.      Allergies  Review of patient's allergies indicates no known allergies.  Home Medications   Prior to Admission medications   Medication Sig Start Date End Date Taking? Authorizing Provider  albuterol (PROVENTIL HFA;VENTOLIN HFA) 108 (90 BASE) MCG/ACT inhaler Inhale 2 puffs into the lungs every 4 (four) hours as needed for wheezing. 07/08/13   Adrian BlackwaterZachary H Baker, PA-C  amLODipine-benazepril (LOTREL) 10-40 MG per capsule Take 1 capsule by mouth daily.    Historical Provider, MD  guaiFENesin-codeine 100-10 MG/5ML syrup Take 10 mLs by mouth every 4 (four) hours as needed for cough. 07/08/13   Graylon GoodZachary H Baker, PA-C  methylPREDNISolone (MEDROL DOSEPAK) 4 MG tablet Use as directed on package instructions 07/08/13   Graylon GoodZachary H Baker, PA-C  omeprazole (PRILOSEC) 40 MG capsule Take 40 mg by mouth daily.    Historical Provider, MD  Spacer/Aero-Holding Chambers (AEROCHAMBER PLUS FLO-VU  LARGE) MISC 1 each by Other route once. 07/08/13   Adrian Blackwater Baker, PA-C  sucralfate (CARAFATE) 1 G tablet Take 1 g by mouth 4 (four) times daily -  with meals and at bedtime.    Historical Provider, MD   BP 148/94  Pulse 98  Temp(Src) 97.7 F (36.5 C) (Oral)  Resp 20  SpO2 99%  LMP 07/04/2013 Physical Exam  Nursing note and vitals reviewed. Constitutional: She is oriented to person, place, and  time. She appears well-developed and well-nourished. No distress.  HENT:  Mouth/Throat: Oropharynx is clear and moist.  Eyes: Conjunctivae are normal. No scleral icterus.  Neck: Neck supple. No tracheal deviation present.  Cardiovascular: Regular rhythm, normal heart sounds and intact distal pulses.  Exam reveals no gallop and no friction rub.   No murmur heard. Tachycardic.   Pulmonary/Chest: Effort normal and breath sounds normal. No respiratory distress.  Abdominal: Soft. Normal appearance and bowel sounds are normal. She exhibits no distension and no mass. There is no tenderness. There is no rebound and no guarding.  Genitourinary:  No cva tenderness  Musculoskeletal: She exhibits no edema and no tenderness.  Thoracic/lumbar spine non tender.   Neurological: She is alert and oriented to person, place, and time.  Steady gait.  Skin: Skin is warm and dry. No rash noted.  No rash/shingles to area of pain  Psychiatric: She has a normal mood and affect.    ED Course  Procedures (including critical care time)  Results for orders placed during the hospital encounter of 07/16/13  CBC      Result Value Ref Range   WBC 9.5  4.0 - 10.5 K/uL   RBC 4.42  3.87 - 5.11 MIL/uL   Hemoglobin 12.6  12.0 - 15.0 g/dL   HCT 16.1  09.6 - 04.5 %   MCV 86.9  78.0 - 100.0 fL   MCH 28.5  26.0 - 34.0 pg   MCHC 32.8  30.0 - 36.0 g/dL   RDW 40.9  81.1 - 91.4 %   Platelets 352  150 - 400 K/uL  COMPREHENSIVE METABOLIC PANEL      Result Value Ref Range   Sodium 137  137 - 147 mEq/L   Potassium 4.0  3.7 - 5.3 mEq/L   Chloride 98  96 - 112 mEq/L   CO2 27  19 - 32 mEq/L   Glucose, Bld 103 (*) 70 - 99 mg/dL   BUN 14  6 - 23 mg/dL   Creatinine, Ser 7.82  0.50 - 1.10 mg/dL   Calcium 9.4  8.4 - 95.6 mg/dL   Total Protein 7.7  6.0 - 8.3 g/dL   Albumin 3.3 (*) 3.5 - 5.2 g/dL   AST 19  0 - 37 U/L   ALT 17  0 - 35 U/L   Alkaline Phosphatase 67  39 - 117 U/L   Total Bilirubin 0.3  0.3 - 1.2 mg/dL   GFR  calc non Af Amer >90  >90 mL/min   GFR calc Af Amer >90  >90 mL/min  LIPASE, BLOOD      Result Value Ref Range   Lipase 37  11 - 59 U/L  URINALYSIS, ROUTINE W REFLEX MICROSCOPIC      Result Value Ref Range   Color, Urine YELLOW  YELLOW   APPearance CLEAR  CLEAR   Specific Gravity, Urine 1.020  1.005 - 1.030   pH 6.0  5.0 - 8.0   Glucose, UA NEGATIVE  NEGATIVE mg/dL  Hgb urine dipstick NEGATIVE  NEGATIVE   Bilirubin Urine NEGATIVE  NEGATIVE   Ketones, ur NEGATIVE  NEGATIVE mg/dL   Protein, ur NEGATIVE  NEGATIVE mg/dL   Urobilinogen, UA 0.2  0.0 - 1.0 mg/dL   Nitrite NEGATIVE  NEGATIVE   Leukocytes, UA NEGATIVE  NEGATIVE  D-DIMER, QUANTITATIVE      Result Value Ref Range   D-Dimer, Quant <0.27  0.00 - 0.48 ug/mL-FEU  PREGNANCY, URINE      Result Value Ref Range   Preg Test, Ur NEGATIVE  NEGATIVE   Dg Chest 2 View  07/16/2013   CLINICAL DATA:  Cough, right chest pain  EXAM: CHEST  2 VIEW  COMPARISON:  07/08/2013  FINDINGS: Low lung volumes noted accentuating the heart size and vascularity. Minor associated basilar atelectasis. No focal pneumonia, collapse or consolidation. Negative for edema, effusion or pneumothorax. Trachea is midline. Prior cholecystectomy noted. Minor thoracic spondylosis evident diffusely.  IMPRESSION: Low volume exam without acute process.   Electronically Signed   By: Ruel Favorsrevor  Shick M.D.   On: 07/16/2013 08:24   Dg Chest 2 View  07/08/2013   CLINICAL DATA:  Cough, chills and body aches for 5 days.  EXAM: CHEST  2 VIEW  COMPARISON:  None.  FINDINGS: Lungs clear. Heart size normal. No pneumothorax or pleural effusion.  IMPRESSION: Negative chest.   Electronically Signed   By: Drusilla Kannerhomas  Dalessio M.D.   On: 07/08/2013 11:54      MDM  Iv ns. Labs.  Reviewed nursing notes and prior charts for additional history.   ddimer and other labs unremarkable for acute process.  Chest xr no pna.   pts pain on recheck appears most likely musculoskeletal, worse w certain  movements and position changes on stretcher. abd soft nt. Pt afeb. Hr 92, rr 16.  Pt drove self to ED. toradol im for pain.  Recheck pt comfortable and appears stable for d/c.      Suzi RootsKevin E Tifany Hirsch, MD 07/16/13 [phone]

## 2013-07-18 ENCOUNTER — Emergency Department (HOSPITAL_COMMUNITY): Payer: Medicaid Other

## 2013-07-18 ENCOUNTER — Encounter (HOSPITAL_COMMUNITY): Payer: Self-pay | Admitting: Emergency Medicine

## 2013-07-18 ENCOUNTER — Emergency Department (HOSPITAL_COMMUNITY)
Admission: EM | Admit: 2013-07-18 | Discharge: 2013-07-19 | Disposition: A | Discharge status: Home / Self Care | Payer: Medicaid Other | Attending: Emergency Medicine | Admitting: Emergency Medicine

## 2013-07-18 DIAGNOSIS — Z791 Long term (current) use of non-steroidal anti-inflammatories (NSAID): Secondary | ICD-10-CM | POA: Insufficient documentation

## 2013-07-18 DIAGNOSIS — Z79899 Other long term (current) drug therapy: Secondary | ICD-10-CM | POA: Insufficient documentation

## 2013-07-18 DIAGNOSIS — R079 Chest pain, unspecified: Secondary | ICD-10-CM | POA: Insufficient documentation

## 2013-07-18 DIAGNOSIS — R0781 Pleurodynia: Secondary | ICD-10-CM

## 2013-07-18 DIAGNOSIS — I10 Essential (primary) hypertension: Secondary | ICD-10-CM | POA: Insufficient documentation

## 2013-07-18 DIAGNOSIS — K219 Gastro-esophageal reflux disease without esophagitis: Secondary | ICD-10-CM | POA: Insufficient documentation

## 2013-07-18 LAB — URINALYSIS, ROUTINE W REFLEX MICROSCOPIC
Bilirubin Urine: NEGATIVE
Glucose, UA: NEGATIVE mg/dL
Hgb urine dipstick: NEGATIVE
Ketones, ur: NEGATIVE mg/dL
Leukocytes, UA: NEGATIVE
Nitrite: NEGATIVE
Protein, ur: NEGATIVE mg/dL
Specific Gravity, Urine: 1.02 (ref 1.005–1.030)
Urobilinogen, UA: 0.2 mg/dL (ref 0.0–1.0)
pH: 5.5 (ref 5.0–8.0)

## 2013-07-18 LAB — COMPREHENSIVE METABOLIC PANEL
ALT: 16 U/L (ref 0–35)
AST: 19 U/L (ref 0–37)
Albumin: 3.1 g/dL — ABNORMAL LOW (ref 3.5–5.2)
Alkaline Phosphatase: 61 U/L (ref 39–117)
BUN: 8 mg/dL (ref 6–23)
CO2: 26 mEq/L (ref 19–32)
Calcium: 8.9 mg/dL (ref 8.4–10.5)
Chloride: 100 mEq/L (ref 96–112)
Creatinine, Ser: 0.67 mg/dL (ref 0.50–1.10)
GFR calc Af Amer: >90 mL/min (ref >90)
GFR calc non Af Amer: >90 mL/min (ref >90)
Glucose, Bld: 94 mg/dL (ref 70–99)
Potassium: 4.2 mEq/L (ref 3.7–5.3)
Sodium: 138 mEq/L (ref 137–147)
Total Bilirubin: 0.3 mg/dL (ref 0.3–1.2)
Total Protein: 7.4 g/dL (ref 6.0–8.3)

## 2013-07-18 LAB — CBC WITH DIFFERENTIAL/PLATELET
Basophils Absolute: 0 10*3/uL (ref 0.0–0.1)
Basophils Relative: 0 % (ref 0–1)
Eosinophils Absolute: 0.1 10*3/uL (ref 0.0–0.7)
Eosinophils Relative: 1 % (ref 0–5)
HCT: 36.5 % (ref 36.0–46.0)
Hemoglobin: 12 g/dL (ref 12.0–15.0)
Lymphocytes Relative: 31 % (ref 12–46)
Lymphs Abs: 3.2 10*3/uL (ref 0.7–4.0)
MCH: 28.3 pg (ref 26.0–34.0)
MCHC: 32.9 g/dL (ref 30.0–36.0)
MCV: 86.1 fL (ref 78.0–100.0)
Monocytes Absolute: 0.6 10*3/uL (ref 0.1–1.0)
Monocytes Relative: 6 % (ref 3–12)
Neutro Abs: 6.2 10*3/uL (ref 1.7–7.7)
Neutrophils Relative %: 62 % (ref 43–77)
Platelets: 328 10*3/uL (ref 150–400)
RBC: 4.24 MIL/uL (ref 3.87–5.11)
RDW: 12.2 % (ref 11.5–15.5)
WBC: 10.2 10*3/uL (ref 4.0–10.5)

## 2013-07-18 LAB — LIPASE, BLOOD: Lipase: 20 U/L (ref 11–59)

## 2013-07-18 MED ORDER — MORPHINE SULFATE 4 MG/ML IJ SOLN
4.0000 mg | Freq: Once | INTRAMUSCULAR | Status: AC
  Administered 2013-07-18: 4 mg via INTRAVENOUS
  Filled 2013-07-18: qty 1

## 2013-07-18 MED ORDER — KETOROLAC TROMETHAMINE 30 MG/ML IJ SOLN
30.0000 mg | Freq: Once | INTRAMUSCULAR | Status: AC
  Administered 2013-07-18: 30 mg via INTRAVENOUS
  Filled 2013-07-18: qty 1

## 2013-07-18 NOTE — ED Notes (Signed)
PER EMS: pt reports upper right abdominal pain since the end of march and has sought treatment for this pain but reports no one has been able to determine cause of her pain. Denies N/V/D/fever or blood in urine. Pt reports pain has been increasing and unbearable. A&Ox4, ambulatory.  Hx of HTN.

## 2013-07-18 NOTE — ED Notes (Signed)
Chris L, PA at bedside. 

## 2013-07-19 MED ORDER — HYDROCODONE-ACETAMINOPHEN 5-325 MG PO TABS: 1.0000 | ORAL_TABLET | Freq: Four times a day (QID) | ORAL | Status: DC | PRN

## 2013-07-19 MED ORDER — ONDANSETRON HCL 4 MG/2ML IJ SOLN
4.0000 mg | Freq: Once | INTRAMUSCULAR | Status: AC
  Administered 2013-07-19: 4 mg via INTRAVENOUS
  Filled 2013-07-19: qty 2

## 2013-07-19 MED ORDER — SODIUM CHLORIDE 0.9 % IV BOLUS (SEPSIS)
1000.0000 mL | Freq: Once | INTRAVENOUS | Status: AC
  Administered 2013-07-19: 1000 mL via INTRAVENOUS

## 2013-07-19 NOTE — Discharge Instructions (Signed)
Return here as needed. Follow up with your doctor. °

## 2013-07-19 NOTE — ED Notes (Signed)
Pts reports her friend will be driving her home. Pt A&Ox4, ambulatory at discharge, verbalizing no complaints at this time.

## 2013-07-19 NOTE — ED Provider Notes (Signed)
CSN: [id number]     Arrival date & time 07/18/13  2006 History   First MD Initiated Contact with Patient 07/18/13 2014     Chief Complaint  Patient presents with  . Abdominal Pain     (Consider location/radiation/quality/duration/timing/severity/associated sxs/prior Treatment) HPI Patient presents emergency department with right lower rib pain.  The patient, states, that it hurts under her right breast in her ribs.  She states that movement and palpation make the pain, worse.  Patient denies chest pain, shortness of breath, nausea, vomiting, diarrhea, headache, blurred vision, weakness, numbness, dizziness, fever, cough, hemoptysis, dysuria, or syncope.  Patient, states she did not take any medications prior to arrival.  States, that she's been eating recently for similar pain.  Patient, states pain is mainly under her right breast in the rib area and not in her abdomen Past Medical History  Diagnosis Date  . Hypertension   . GERD (gastroesophageal reflux disease)    Past Surgical History  Procedure Laterality Date  . Cholecystectomy    . Foot surgery    . Cesarean section     No family history on file. History  Substance Use Topics  . Smoking status: Never Smoker   . Smokeless tobacco: Not on file  . Alcohol Use: No   OB History   Grav Para Term Preterm Abortions TAB SAB Ect Mult Living                 Review of Systems All other systems negative except as documented in the HPI. All pertinent positives and negatives as reviewed in the HPI.   Allergies  Review of patient's allergies indicates no known allergies.  Home Medications   Prior to Admission medications   Medication Sig Start Date End Date Taking? Authorizing Provider  albuterol (PROVENTIL HFA;VENTOLIN HFA) 108 (90 BASE) MCG/ACT inhaler Inhale 2 puffs into the lungs every 4 (four) hours as needed for wheezing. 07/08/13  Yes Adrian BlackwaterZachary H Baker, PA-C  amLODipine-benazepril (LOTREL) 10-40 MG per capsule Take 1 capsule  by mouth daily.   Yes Historical Provider, MD  guaiFENesin-codeine 100-10 MG/5ML syrup Take 10 mLs by mouth every 4 (four) hours as needed for cough. 07/08/13  Yes Adrian BlackwaterZachary H Baker, PA-C  ibuprofen (ADVIL,MOTRIN) 800 MG tablet Take 800 mg by mouth 3 (three) times daily.   Yes Historical Provider, MD  omeprazole (PRILOSEC) 40 MG capsule Take 40 mg by mouth daily.   Yes Historical Provider, MD  sucralfate (CARAFATE) 1 G tablet Take 1 g by mouth 4 (four) times daily -  with meals and at bedtime.   Yes Historical Provider, MD  traMADol (ULTRAM) 50 MG tablet Take 1 tablet (50 mg total) by mouth every 6 (six) hours as needed. 07/16/13  Yes Suzi RootsKevin E Steinl, MD   BP 140/90  Pulse 67  Temp(Src) 98 F (36.7 C) (Oral)  Resp 26  SpO2 100%  LMP 07/04/2013 Physical Exam  Nursing note and vitals reviewed. Constitutional: She is oriented to person, place, and time. She appears well-developed and well-nourished. No distress.  HENT:  Head: Normocephalic.  Mouth/Throat: Oropharynx is clear and moist.  Eyes: Pupils are equal, round, and reactive to light.  Neck: Normal range of motion. Neck supple.  Cardiovascular: Normal rate, regular rhythm and normal heart sounds.  Exam reveals no gallop and no friction rub.   No murmur heard. Pulmonary/Chest: Effort normal and breath sounds normal. No respiratory distress. She exhibits tenderness. She exhibits no crepitus, no deformity, no swelling and no  retraction.    Neurological: She is alert and oriented to person, place, and time. She exhibits normal muscle tone. Coordination normal.  Skin: Skin is warm and dry. No rash noted. No erythema.    ED Course  Procedures (including critical care time) Labs Review Labs Reviewed  COMPREHENSIVE METABOLIC PANEL - Abnormal; Notable for the following:    Albumin 3.1 (*)    All other components within normal limits  CBC WITH DIFFERENTIAL  LIPASE, BLOOD  URINALYSIS, ROUTINE W REFLEX MICROSCOPIC    Imaging Review Dg  Chest 2 View  07/18/2013   CLINICAL DATA:  Shortness of breath and right upper quadrant abdominal pain.  EXAM: CHEST - 2 VIEW  COMPARISON:  DG CHEST 2 VIEW dated 07/16/2013  FINDINGS: The heart size and mediastinal contours are within normal limits. There is mild bibasilar atelectasis. There is no evidence of pulmonary edema, consolidation, pneumothorax, nodule or pleural fluid. The visualized skeletal structures are unremarkable.  IMPRESSION: No active disease.   Electronically Signed   By: Irish LackGlenn  Yamagata M.D.   On: 07/18/2013 21:36     Patient is stable at this time and I do not feel that she has abdominal pain, as her pain is over her rib cage.  Patient is advised to return here as needed.  Also advised to follow up with her primary care Dr. for further evaluation and recheck.  Patient's pain has improved following pain medication  Carlyle DollyChristopher W Nailah Luepke, PA-C 07/21/13 [phone]

## 2013-07-22 ENCOUNTER — Other Ambulatory Visit: Payer: Self-pay | Admitting: Family Medicine

## 2013-07-22 DIAGNOSIS — R109 Unspecified abdominal pain: Secondary | ICD-10-CM

## 2013-07-22 DIAGNOSIS — R112 Nausea with vomiting, unspecified: Secondary | ICD-10-CM

## 2013-07-24 NOTE — ED Provider Notes (Signed)
Medical screening examination/treatment/procedure(s) were performed by non-physician practitioner and as supervising physician I was immediately available for consultation/collaboration.   EKG Interpretation None       Raeford RazorStephen Naomi Fitton, MD 07/24/13 1443

## 2013-07-25 ENCOUNTER — Ambulatory Visit
Admission: RE | Admit: 2013-07-25 | Discharge: 2013-07-25 | Disposition: A | Discharge status: Home / Self Care | Payer: Medicaid Other | Source: Ambulatory Visit | Attending: Family Medicine | Admitting: Family Medicine

## 2013-07-25 DIAGNOSIS — R109 Unspecified abdominal pain: Secondary | ICD-10-CM

## 2013-07-25 DIAGNOSIS — R112 Nausea with vomiting, unspecified: Secondary | ICD-10-CM

## 2014-05-24 ENCOUNTER — Emergency Department (HOSPITAL_COMMUNITY)
Admission: EM | Admit: 2014-05-24 | Discharge: 2014-05-24 | Disposition: A | Discharge status: Home / Self Care | Payer: Medicaid Other | Attending: Emergency Medicine | Admitting: Emergency Medicine

## 2014-05-24 ENCOUNTER — Encounter (HOSPITAL_COMMUNITY): Payer: Self-pay

## 2014-05-24 DIAGNOSIS — Z79899 Other long term (current) drug therapy: Secondary | ICD-10-CM | POA: Insufficient documentation

## 2014-05-24 DIAGNOSIS — I10 Essential (primary) hypertension: Secondary | ICD-10-CM | POA: Insufficient documentation

## 2014-05-24 DIAGNOSIS — R200 Anesthesia of skin: Secondary | ICD-10-CM | POA: Diagnosis present

## 2014-05-24 DIAGNOSIS — R030 Elevated blood-pressure reading, without diagnosis of hypertension: Secondary | ICD-10-CM

## 2014-05-24 DIAGNOSIS — K219 Gastro-esophageal reflux disease without esophagitis: Secondary | ICD-10-CM | POA: Diagnosis not present

## 2014-05-24 DIAGNOSIS — IMO0001 Reserved for inherently not codable concepts without codable children: Secondary | ICD-10-CM

## 2014-05-24 LAB — BASIC METABOLIC PANEL
ANION GAP: 8 (ref 5–15)
BUN: 13 mg/dL (ref 6–23)
CHLORIDE: 105 mmol/L (ref 96–112)
CO2: 25 mmol/L (ref 19–32)
CREATININE: 0.67 mg/dL (ref 0.50–1.10)
Calcium: 8.6 mg/dL (ref 8.4–10.5)
GFR calc Af Amer: >90 mL/min (ref >90)
Glucose, Bld: 98 mg/dL (ref 70–99)
POTASSIUM: 3.7 mmol/L (ref 3.5–5.1)
Sodium: 138 mmol/L (ref 135–145)

## 2014-05-24 LAB — CBC
HCT: 34.7 % — ABNORMAL LOW (ref 36.0–46.0)
Hemoglobin: 11.3 g/dL — ABNORMAL LOW (ref 12.0–15.0)
MCH: 27.8 pg (ref 26.0–34.0)
MCHC: 32.6 g/dL (ref 30.0–36.0)
MCV: 85.5 fL (ref 78.0–100.0)
Platelets: 275 10*3/uL (ref 150–400)
RBC: 4.06 MIL/uL (ref 3.87–5.11)
RDW: 12.8 % (ref 11.5–15.5)
WBC: 6 10*3/uL (ref 4.0–10.5)

## 2014-05-24 LAB — I-STAT TROPONIN, ED: TROPONIN I, POC: 0 ng/mL (ref 0.00–0.08)

## 2014-05-24 MED ORDER — AMLODIPINE BESYLATE 10 MG PO TABS: 10.0000 mg | ORAL_TABLET | Freq: Every day | ORAL | Status: DC

## 2014-05-24 MED ORDER — OMEPRAZOLE 40 MG PO CPDR: 40.0000 mg | DELAYED_RELEASE_CAPSULE | Freq: Every day | ORAL | Status: DC

## 2014-05-24 MED ORDER — AMLODIPINE BESYLATE 10 MG PO TABS
10.0000 mg | ORAL_TABLET | Freq: Once | ORAL | Status: AC
  Administered 2014-05-24: 10 mg via ORAL
  Filled 2014-05-24: qty 1

## 2014-05-24 NOTE — ED Provider Notes (Signed)
CSN: [id number]     Arrival date & time 05/24/14  1910 History   First MD Initiated Contact with Patient 05/24/14 1929     Chief Complaint  Patient presents with  . Numbness     (Consider location/radiation/quality/duration/timing/severity/associated sxs/prior Treatment) HPI Comments: 44 year old female with a past medical history of hypertension and GERD presenting with intermittent left arm numbness 5 days. States her arm has been numb mostly on the dorsal aspect of her forearm, occasionally into her shoulder. Denies any pain. Nothing in specific makes the numbness, go, it does not last for any specific. Time. 2 days ago she had an episode of dizziness when she stood up, a sensation that she was spinning. The dizziness only lasted a few minutes and has not returned. States if the past week she's been experiencing frontal headaches that are relieved by ibuprofen. Reports she gets headaches when she is on her menstrual cycle which she is currently on. Denies chest pain, shortness of breath or vomiting. Admits to mild nausea. States she's been experiencing mild heartburn in her epigastric area. Denies abdominal pain. Reports she's been off of her blood pressure medication for a few months since she cannot afford it. Denies vision change, tinnitus.  The history is provided by the patient.    Past Medical History  Diagnosis Date  . Hypertension   . GERD (gastroesophageal reflux disease)    Past Surgical History  Procedure Laterality Date  . Cholecystectomy    . Foot surgery    . Cesarean section     No family history on file. History  Substance Use Topics  . Smoking status: Never Smoker   . Smokeless tobacco: Not on file  . Alcohol Use: No   OB History    No data available     Review of Systems  Gastrointestinal: Positive for nausea.  Neurological: Positive for dizziness, numbness and headaches.  All other systems reviewed and are negative.     Allergies  Review of  patient's allergies indicates no known allergies.  Home Medications   Prior to Admission medications   Medication Sig Start Date End Date Taking? Authorizing Provider  amLODipine-benazepril (LOTREL) 10-40 MG per capsule Take 1 capsule by mouth daily.   Yes Historical Provider, MD  ibuprofen (ADVIL,MOTRIN) 200 MG tablet Take 800 mg by mouth every 6 (six) hours as needed for moderate pain (pain).   Yes Historical Provider, MD  albuterol (PROVENTIL HFA;VENTOLIN HFA) 108 (90 BASE) MCG/ACT inhaler Inhale 2 puffs into the lungs every 4 (four) hours as needed for wheezing. Patient not taking: Reported on 05/24/2014 07/08/13   [name], PA-C  amLODipine (NORVASC) 10 MG tablet Take 1 tablet (10 mg total) by mouth daily. 05/24/14   Josie Burleigh M Domingo Fuson, PA-C  guaiFENesin-codeine 100-10 MG/5ML syrup Take 10 mLs by mouth every 4 (four) hours as needed for cough. Patient not taking: Reported on 05/24/2014 07/08/13   [name], PA-C  HYDROcodone-acetaminophen (NORCO/VICODIN) 5-325 MG per tablet Take 1 tablet by mouth every 6 (six) hours as needed for moderate pain. Patient not taking: Reported on 05/24/2014 07/19/13   Jamesetta Orleans Lawyer, PA-C  omeprazole (PRILOSEC) 40 MG capsule Take 1 capsule (40 mg total) by mouth daily. 05/24/14   [name], PA-C  traMADol (ULTRAM) 50 MG tablet Take 1 tablet (50 mg total) by mouth every 6 (six) hours as needed. Patient not taking: Reported on 05/24/2014 07/16/13   Suzi Roots, MD   BP 124/77 mmHg  Pulse  85  Temp(Src) 97.4 F (36.3 C) (Oral)  Resp 18  SpO2 98%  LMP 05/19/2014 (Exact Date) Physical Exam  Constitutional: She is oriented to person, place, and time. She appears well-developed and well-nourished. No distress.  HENT:  Head: Normocephalic and atraumatic.  Mouth/Throat: Oropharynx is clear and moist.  Eyes: Conjunctivae and EOM are normal. Pupils are equal, round, and reactive to light.  Neck: Normal range of motion. Neck supple. No spinous process  tenderness and no muscular tenderness present.  Cardiovascular: Normal rate, regular rhythm, normal heart sounds and intact distal pulses.   Pulmonary/Chest: Effort normal and breath sounds normal. No respiratory distress.  Abdominal: Soft. Bowel sounds are normal. There is no tenderness.  Musculoskeletal: Normal range of motion. She exhibits no edema.  Neurological: She is alert and oriented to person, place, and time. She has normal strength. No cranial nerve deficit or sensory deficit. Coordination and gait normal.  Strength lower extremities 5/5 and equal bilateral. Sensation intact and equal bilateral. Normal gait.  Skin: Skin is warm and dry. She is not diaphoretic.  Psychiatric: She has a normal mood and affect. Her behavior is normal.  Nursing note and vitals reviewed.   ED Course  Procedures (including critical care time) Labs Review Labs Reviewed  CBC - Abnormal; Notable for the following:    Hemoglobin 11.3 (*)    HCT 34.7 (*)    All other components within normal limits  BASIC METABOLIC PANEL  I-STAT TROPOININ, ED    Imaging Review No results found.   EKG Interpretation   Date/Time:  Saturday May 24 2014 19:26:14 EST Ventricular Rate:  95 PR Interval:  136 QRS Duration: 82 QT Interval:  346 QTC Calculation: 435 R Axis:   43 Text Interpretation:  Sinus arrhythmia Borderline T abnormalities, diffuse  leads Baseline wander in lead(s) V5 No old tracing to compare Confirmed by  JACUBOWITZ  MD, SAM [phone]([id number]) on 05/24/2014 9:17:29 PM      MDM   Final diagnoses:  Elevated blood pressure  Essential hypertension   Nontoxic appearing and in no apparent distress. Hypertensive on arrival, vitals otherwise stable. Blood pressure 184/101. She's been out of her blood pressure medications for a few months due to money issues. She was previously on Lotrel (amlodipine-benazepril), 10 mg amlodipine given in the ED with successful reduction of blood pressure to 124/77.  Numbness, headache and dizziness not present throughout this encounter. Dizziness is only one episode and has not returned. Unremarkable neurologic exam. Doubt cardiac, heart score 2. I discussed importance of compliance of medications. I will prescribe her amlodipine as this is available on [employer] $3.99 list. She is also requesting refill on Prilosec which she takes daily. Stable for discharge. Follow-up with PCP within the next week. Return precautions given. Patient states understanding of treatment care plan and is agreeable.  Kathrynn SpeedRobyn M Horst Ostermiller, PA-C 05/24/14 2121  Doug SouSam Jacubowitz, MD 05/25/14 [phone]

## 2014-05-24 NOTE — Discharge Instructions (Signed)
It is important to take your blood pressure medication daily. Follow-up with your primary care physician.  Hypertension Hypertension, commonly called high blood pressure, is when the force of blood pumping through your arteries is too strong. Your arteries are the blood vessels that carry blood from your heart throughout your body. A blood pressure reading consists of a higher number over a lower number, such as 110/72. The higher number (systolic) is the pressure inside your arteries when your heart pumps. The lower number (diastolic) is the pressure inside your arteries when your heart relaxes. Ideally you want your blood pressure below 120/80. Hypertension forces your heart to work harder to pump blood. Your arteries may become narrow or stiff. Having hypertension puts you at risk for heart disease, stroke, and other problems.  RISK FACTORS Some risk factors for high blood pressure are controllable. Others are not.  Risk factors you cannot control include:   Race. You may be at higher risk if you are African American.  Age. Risk increases with age.  Gender. Men are at higher risk than women before age 945 years. After age 44, women are at higher risk than men. Risk factors you can control include:  Not getting enough exercise or physical activity.  Being overweight.  Getting too much fat, sugar, calories, or salt in your diet.  Drinking too much alcohol. SIGNS AND SYMPTOMS Hypertension does not usually cause signs or symptoms. Extremely high blood pressure (hypertensive crisis) may cause headache, anxiety, shortness of breath, and nosebleed. DIAGNOSIS  To check if you have hypertension, your health care provider will measure your blood pressure while you are seated, with your arm held at the level of your heart. It should be measured at least twice using the same arm. Certain conditions can cause a difference in blood pressure between your right and left arms. A blood pressure reading  that is higher than normal on one occasion does not mean that you need treatment. If one blood pressure reading is high, ask your health care provider about having it checked again. TREATMENT  Treating high blood pressure includes making lifestyle changes and possibly taking medicine. Living a healthy lifestyle can help lower high blood pressure. You may need to change some of your habits. Lifestyle changes may include:  Following the DASH diet. This diet is high in fruits, vegetables, and whole grains. It is low in salt, red meat, and added sugars.  Getting at least 2 hours of brisk physical activity every week.  Losing weight if necessary.  Not smoking.  Limiting alcoholic beverages.  Learning ways to reduce stress. If lifestyle changes are not enough to get your blood pressure under control, your health care provider may prescribe medicine. You may need to take more than one. Work closely with your health care provider to understand the risks and benefits. HOME CARE INSTRUCTIONS  Have your blood pressure rechecked as directed by your health care provider.   Take medicines only as directed by your health care provider. Follow the directions carefully. Blood pressure medicines must be taken as prescribed. The medicine does not work as well when you skip doses. Skipping doses also puts you at risk for problems.   Do not smoke.   Monitor your blood pressure at home as directed by your health care provider. SEEK MEDICAL CARE IF:   You think you are having a reaction to medicines taken.  You have recurrent headaches or feel dizzy.  You have swelling in your ankles.  You have  trouble with your vision. SEEK IMMEDIATE MEDICAL CARE IF:  You develop a severe headache or confusion.  You have unusual weakness, numbness, or feel faint.  You have severe chest or abdominal pain.  You vomit repeatedly.  You have trouble breathing. MAKE SURE YOU:   Understand these  instructions.  Will watch your condition.  Will get help right away if you are not doing well or get worse. Document Released: 03/21/2005 Document Revised: 08/05/2013 Document Reviewed: 01/11/2013 [facility] Patient Information 2015 [place], [place]. This information is not intended to replace advice given to you by your health care provider. Make sure you discuss any questions you have with your health care provider.

## 2014-05-24 NOTE — ED Notes (Signed)
Patient reports she has had left arm numbness x 2 days, heartburn intermittently, and an episode of dizziness yesterday.  She has not had hypertension medication in months.

## 2014-07-14 ENCOUNTER — Ambulatory Visit
Admission: RE | Admit: 2014-07-14 | Discharge: 2014-07-14 | Disposition: A | Discharge status: Home / Self Care | Payer: Medicaid Other | Source: Ambulatory Visit | Attending: Family Medicine | Admitting: Family Medicine

## 2014-07-14 ENCOUNTER — Other Ambulatory Visit: Payer: Self-pay | Admitting: Family Medicine

## 2014-07-14 DIAGNOSIS — R200 Anesthesia of skin: Secondary | ICD-10-CM

## 2014-07-14 DIAGNOSIS — R202 Paresthesia of skin: Principal | ICD-10-CM

## 2015-04-20 IMAGING — CR DG SHOULDER 2+V*L*
3 series · 3 of 3 positions shown · non-contrast
Comparison: None.

CLINICAL DATA: Left shoulder pain and left arm numbness for several
weeks.

EXAM:
LEFT SHOULDER - 2+ VIEW

[w shoulder ap internal left *]
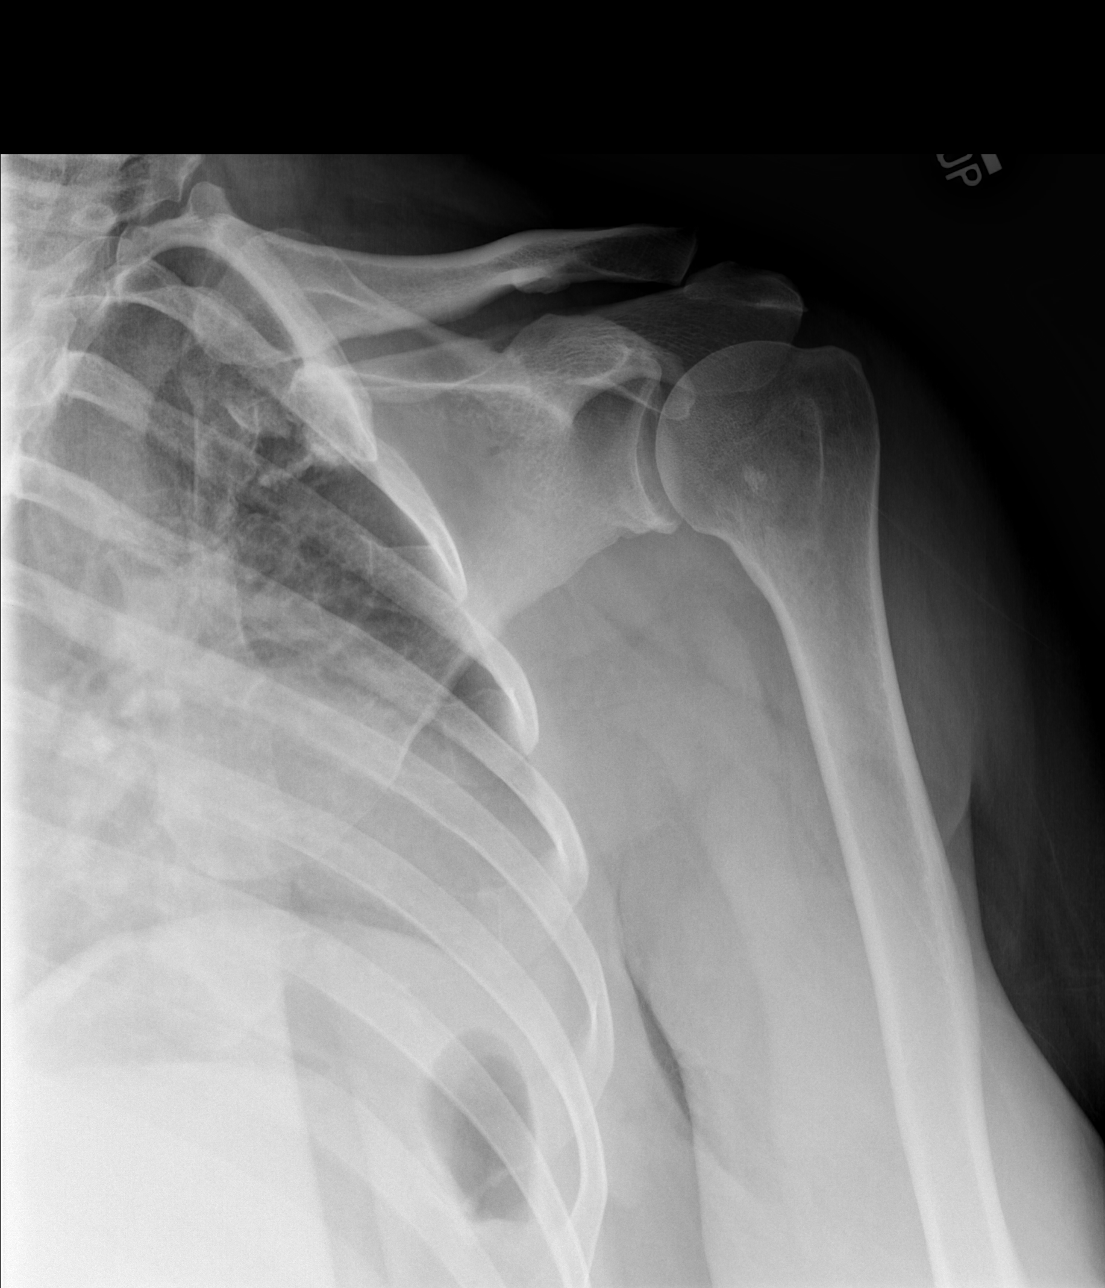

[w shoulder y view left *]
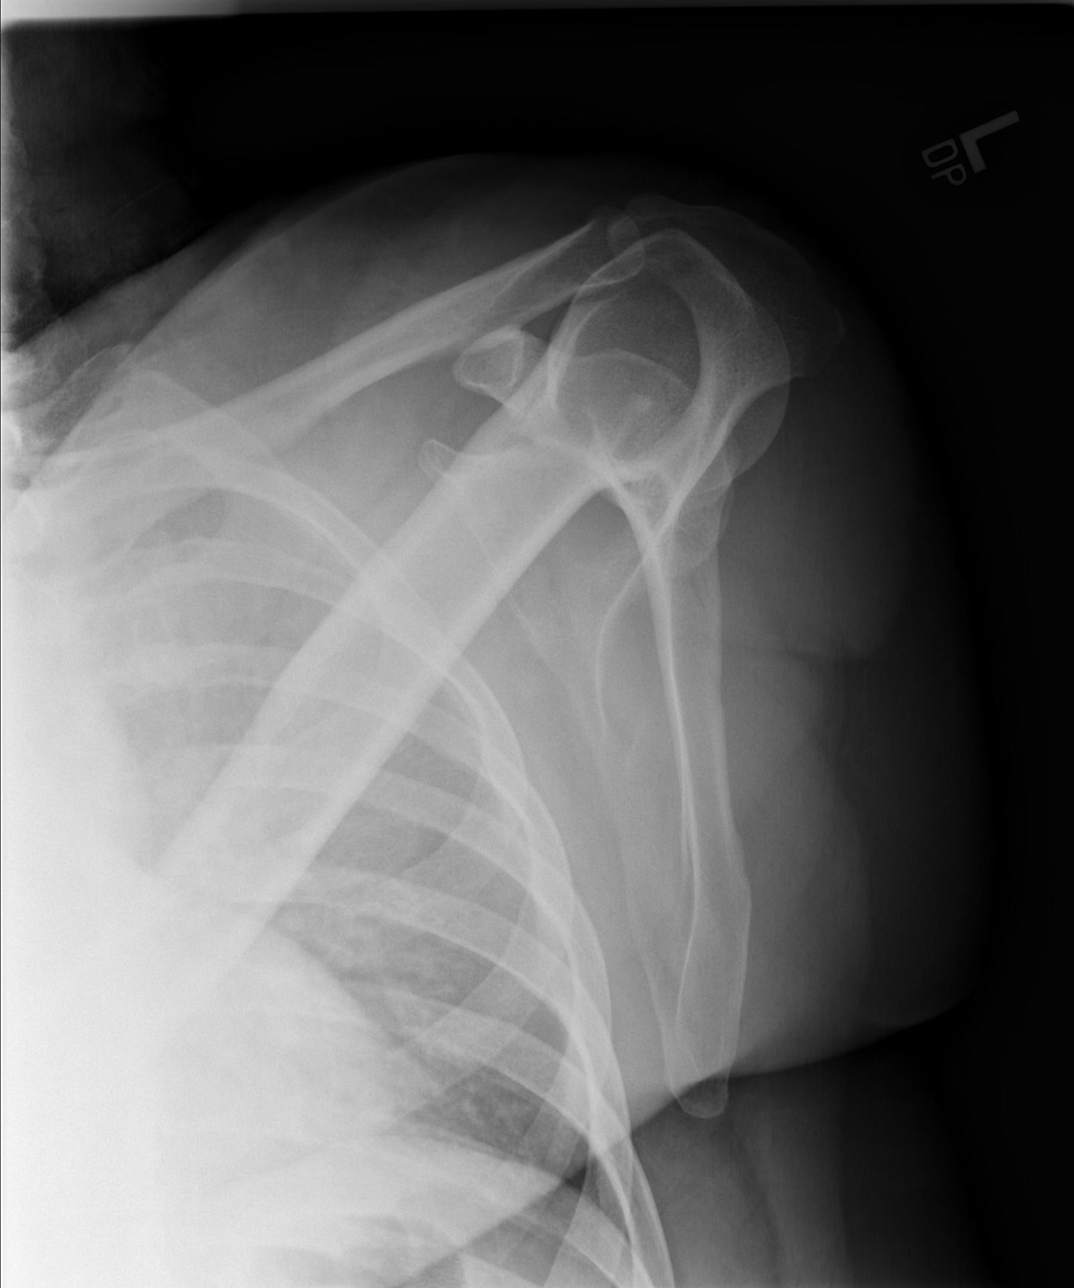

[w shoulder axillary left *]
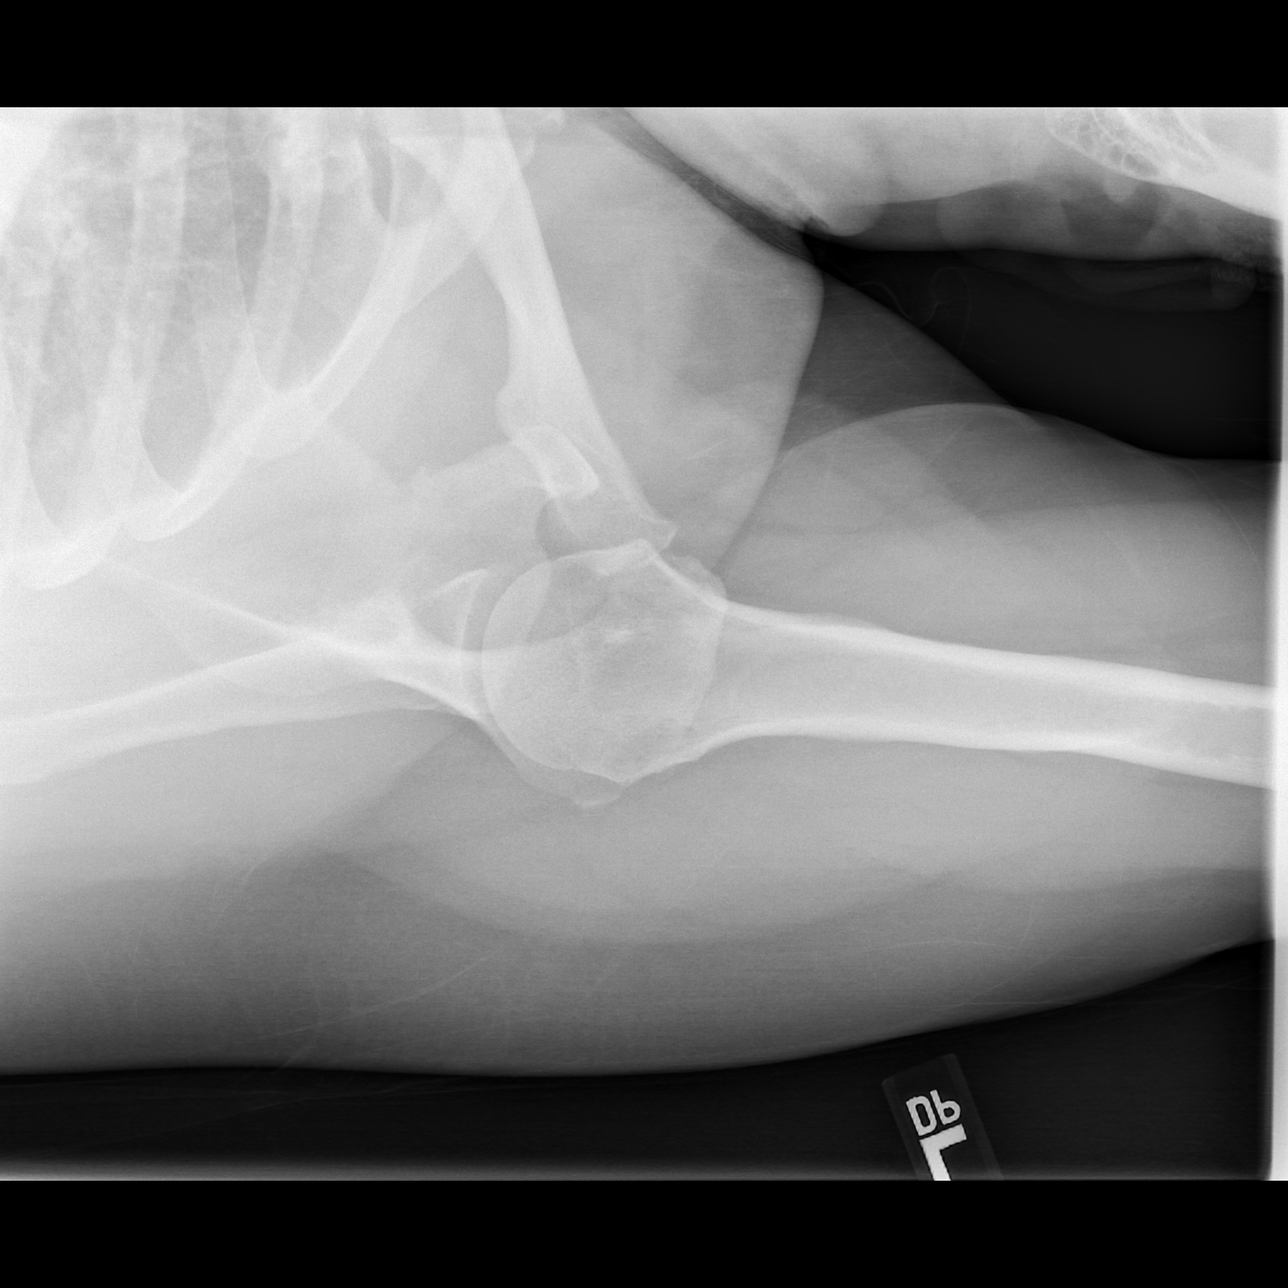

[3 of 3 positions shown; findings below may reference images not displayed]

FINDINGS: The joint spaces are maintained. No acute bony findings. The
visualized left lung is clear. The left ribs are intact.
IMPRESSION: No acute bony findings.

## 2015-04-20 IMAGING — CR DG CERVICAL SPINE COMPLETE 4+V
6 series · 6 of 6 positions shown · non-contrast
Comparison: None.

CLINICAL DATA: Numbness and tingling in the left arm

EXAM:
CERVICAL SPINE  4+ VIEWS

[w c-spine lat]
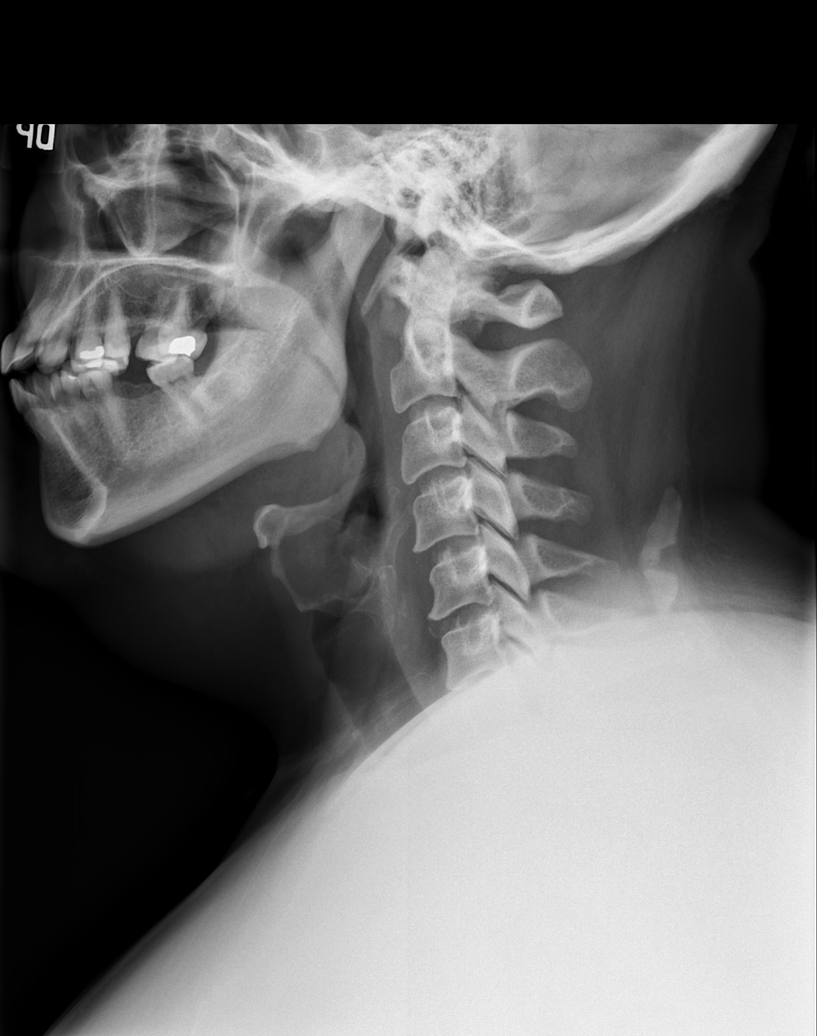

[w c-spine oblique (1 of 2)]
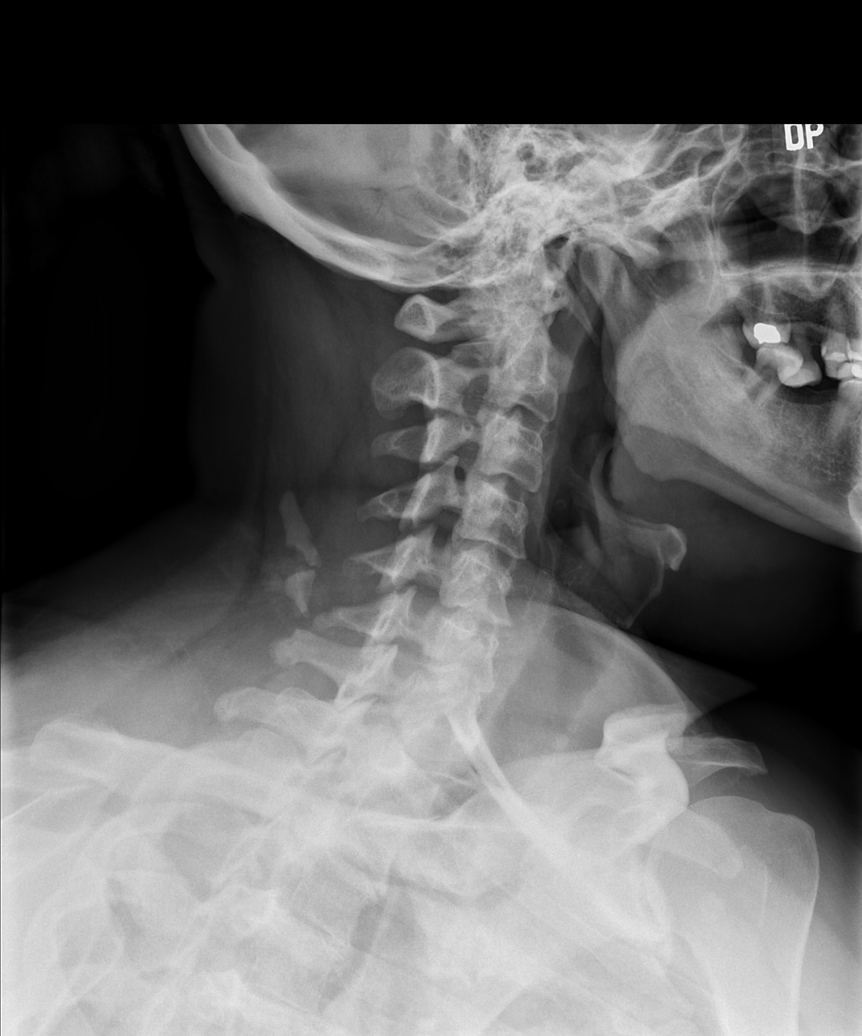

[w c-spine oblique (2 of 2)]
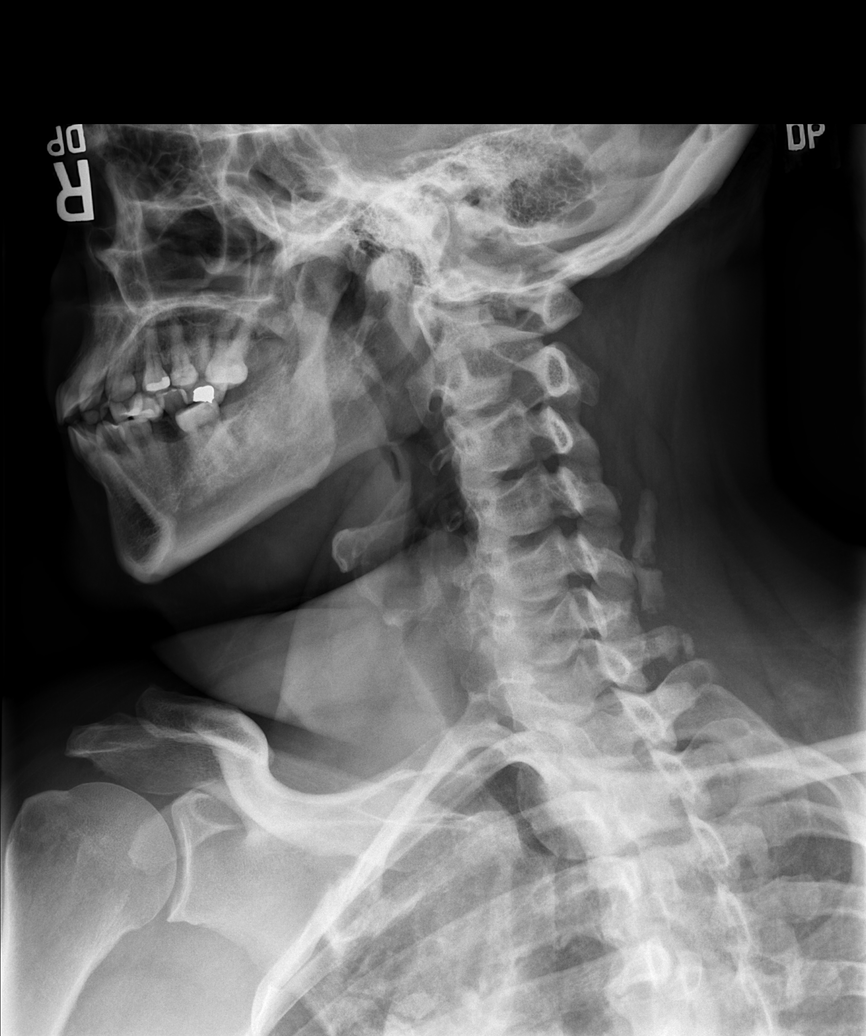

[w c-spine a.p. *]
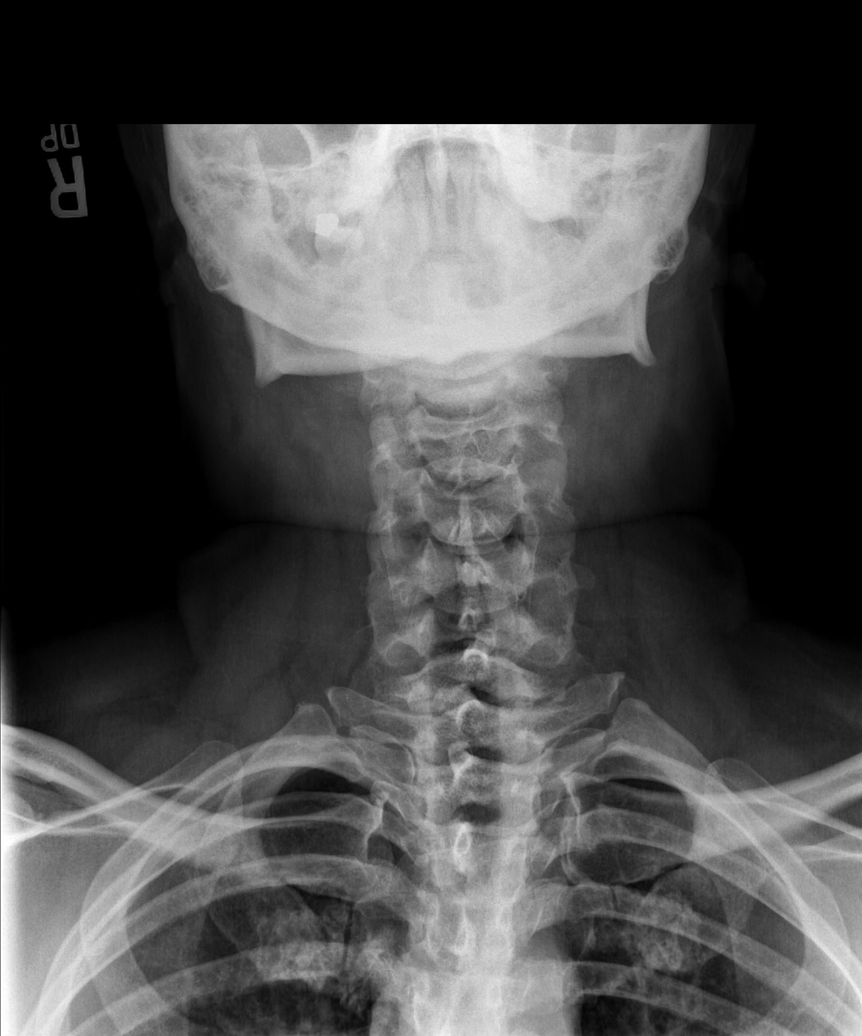

[w c-spine odontoid *]
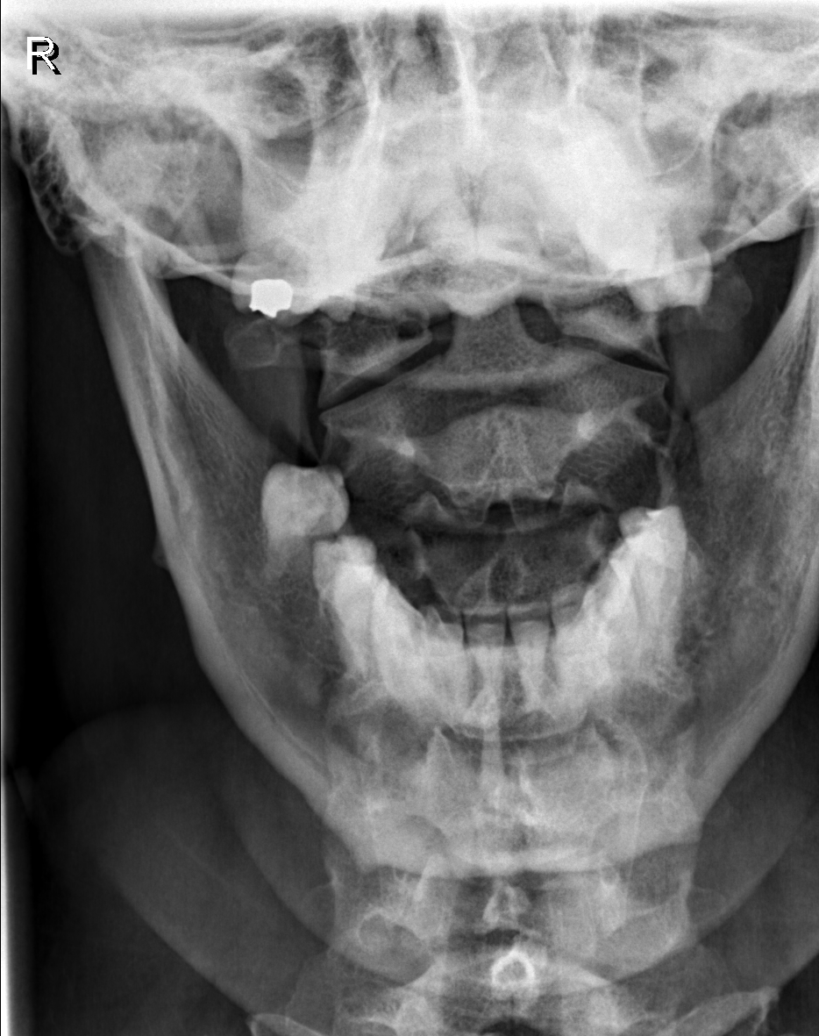

[w swimmers view *]
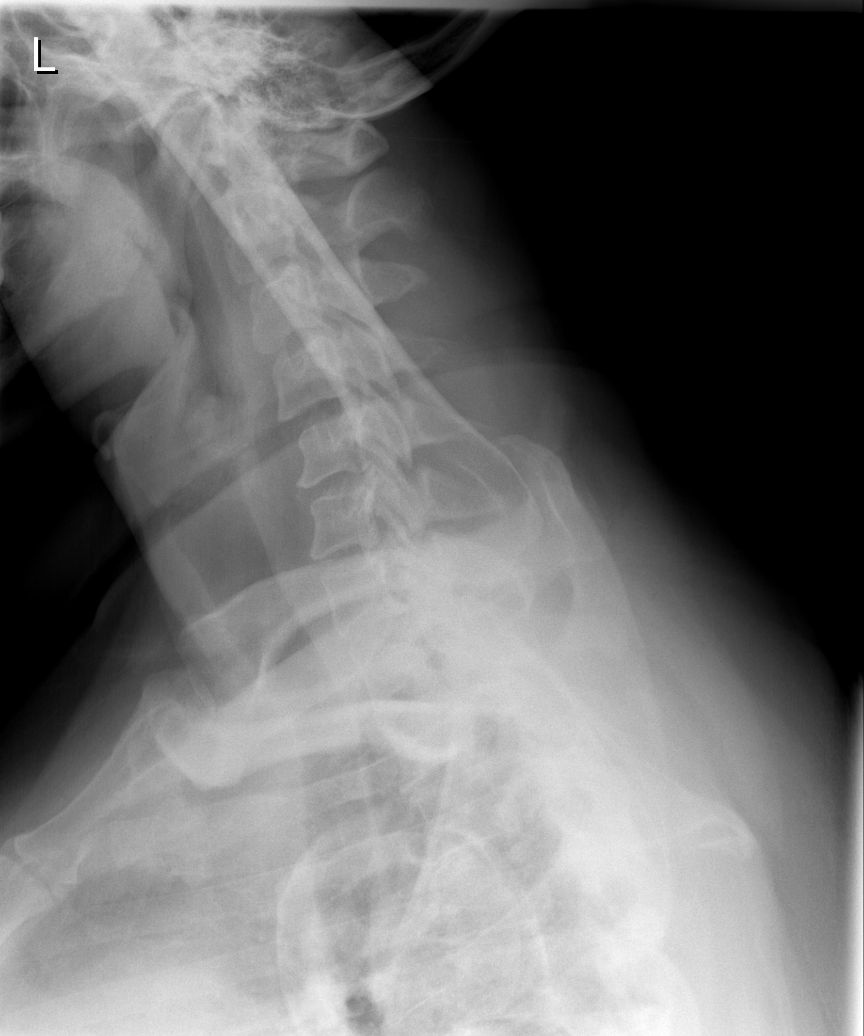

[6 of 6 positions shown; findings below may reference images not displayed]

FINDINGS: Vertebral body height is well maintained. No significant neural
foraminal changes are noted. Heavy ligamentous calcifications are
noted in the soft tissues posteriorly.
IMPRESSION: No acute abnormality noted.

## 2015-12-12 ENCOUNTER — Encounter (HOSPITAL_COMMUNITY): Payer: Self-pay | Admitting: Emergency Medicine

## 2015-12-12 ENCOUNTER — Emergency Department (HOSPITAL_COMMUNITY)
Admission: EM | Admit: 2015-12-12 | Discharge: 2015-12-12 | Disposition: A | Discharge status: Home / Self Care | Payer: Self-pay | Attending: Emergency Medicine | Admitting: Emergency Medicine

## 2015-12-12 ENCOUNTER — Emergency Department (HOSPITAL_COMMUNITY): Payer: Self-pay

## 2015-12-12 DIAGNOSIS — M25532 Pain in left wrist: Secondary | ICD-10-CM | POA: Insufficient documentation

## 2015-12-12 DIAGNOSIS — Z79899 Other long term (current) drug therapy: Secondary | ICD-10-CM | POA: Insufficient documentation

## 2015-12-12 DIAGNOSIS — I10 Essential (primary) hypertension: Secondary | ICD-10-CM | POA: Insufficient documentation

## 2015-12-12 DIAGNOSIS — Z791 Long term (current) use of non-steroidal anti-inflammatories (NSAID): Secondary | ICD-10-CM | POA: Insufficient documentation

## 2015-12-12 MED ORDER — NAPROXEN 250 MG PO TABS: 250.0000 mg | ORAL_TABLET | Freq: Two times a day (BID) | ORAL | 0 refills | Status: DC

## 2015-12-12 NOTE — ED Triage Notes (Signed)
Pt c/o left thumb pain radiating to forearm for 2 weeks. Pt denies injury, no redness or inflammation notes on assessment. L radial pulse present.

## 2015-12-12 NOTE — ED Provider Notes (Signed)
[facility]-EMERGENCY DEPT Provider Note   CSN: [id number] Arrival date & time: 12/12/15  0734     History   Chief Complaint Chief Complaint  Patient presents with  . Hand Pain    HPI Connie Castaneda is a 45 y.o. female.  Connie Castaneda is a 45 y.o. Female who presents to the emergency department complaining of pain from her left thumb that radiates to her left wrist for the past 2 weeks. She denies any injury or trauma to her hand or wrist. She is right hand dominant. 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 She denies any numbness, tingling or weakness. She reports pain is worse with range of motion. She has taken nothing for treatment of her symptoms today. She denies fevers, numbness, tingling, elbow pain, shoulder pain, weakness or trauma to her hand.   The history is provided by the patient. No language interpreter was used.  Hand Pain     Past Medical History:  Diagnosis Date  . GERD (gastroesophageal reflux disease)   . Hypertension     There are no active problems to display for this patient.   Past Surgical History:  Procedure Laterality Date  . CESAREAN SECTION    . CHOLECYSTECTOMY    . FOOT SURGERY      OB History    No data available       Home Medications    Prior to Admission medications   Medication Sig Start Date End Date Taking? Authorizing Provider  albuterol (PROVENTIL HFA;VENTOLIN HFA) 108 (90 BASE) MCG/ACT inhaler Inhale 2 puffs into the lungs every 4 (four) hours as needed for wheezing. Patient not taking: Reported on 05/24/2014 07/08/13   [name], PA-C  amLODipine (NORVASC) 10 MG tablet Take 1 tablet (10 mg total) by mouth daily. 05/24/14   [name], PA-C  amLODipine-benazepril (LOTREL) 10-40 MG per capsule Take 1 capsule by mouth daily.    Historical Provider, MD  guaiFENesin-codeine 100-10 MG/5ML syrup Take 10 mLs by mouth every 4 (four) hours as needed for cough. Patient not taking: Reported on 05/24/2014 07/08/13    [name], PA-C  HYDROcodone-acetaminophen (NORCO/VICODIN) 5-325 MG per tablet Take 1 tablet by mouth every 6 (six) hours as needed for moderate pain. Patient not taking: Reported on 05/24/2014 07/19/13   Charlestine Night, PA-C  ibuprofen (ADVIL,MOTRIN) 200 MG tablet Take 800 mg by mouth every 6 (six) hours as needed for moderate pain (pain).    Historical Provider, MD  naproxen (NAPROSYN) 250 MG tablet Take 1 tablet (250 mg total) by mouth 2 (two) times daily with a meal. 12/12/15   [name], PA-C  omeprazole (PRILOSEC) 40 MG capsule Take 1 capsule (40 mg total) by mouth daily. 05/24/14   [name], PA-C  traMADol (ULTRAM) 50 MG tablet Take 1 tablet (50 mg total) by mouth every 6 (six) hours as needed. Patient not taking: Reported on 05/24/2014 07/16/13   [name], MD    Family History No family history on file.  Social History Social History  Substance Use Topics  . Smoking status: Never Smoker  . Smokeless tobacco: Never Used  . Alcohol use No     Allergies   Review of patient's allergies indicates no known allergies.   Review of Systems Review of Systems  Constitutional: Negative for fever.  Musculoskeletal: Positive for arthralgias. Negative for joint swelling.  Skin: Negative for rash and wound.  Neurological: Negative for weakness and numbness.  Physical Exam Updated Vital Signs BP 147/79   Pulse 71   Temp 97.8 F (36.6 C) (Oral)   Resp 18   Wt 117.9 kg   LMP 12/04/2015   SpO2 100%   Physical Exam  Constitutional: She appears well-developed and well-nourished. No distress.  HENT:  Head: Normocephalic and atraumatic.  Eyes: Right eye exhibits no discharge. Left eye exhibits no discharge.  Cardiovascular: Normal rate, regular rhythm and intact distal pulses.   Bilateral radial pulses are intact. Good capillary refill to his distal fingertips.  Pulmonary/Chest: Effort normal. No respiratory distress.  Musculoskeletal: Normal range of motion.  She exhibits tenderness. She exhibits no edema or deformity.  Patient has mild tenderness over her left MCP joint of her first digit as well as to her lateral aspect of her left wrist. No deformity, erythema, or ecchymosis. Good range of motion of her wrist and hand without difficulty. Good strength with flexion and extension of her thumb. No snuff box tenderness. No left elbow or shoulder tenderness to palpation.  Neurological: She is alert. Coordination normal.  Sensation is intact her bilateral distal fingertips.  Skin: Skin is warm and dry. Capillary refill takes less than 2 seconds. No rash noted. She is not diaphoretic. No erythema. No pallor.  Psychiatric: She has a normal mood and affect. Her behavior is normal.  Nursing note and vitals reviewed.    ED Treatments / Results  Labs (all labs ordered are listed, but only abnormal results are displayed) Labs Reviewed - No data to display  EKG  EKG Interpretation None       Radiology Dg Wrist Complete Left  Result Date: 12/12/2015 CLINICAL DATA:  Patient with left thumb pain radiating to the forearm for 2 weeks. EXAM: LEFT WRIST - COMPLETE 3+ VIEW COMPARISON:  None. FINDINGS: There is no evidence of fracture or dislocation. There is no evidence of arthropathy or other focal bone abnormality. Soft tissues are unremarkable. IMPRESSION: No acute osseous abnormality. Electronically Signed   By: Annia Beltrew  Davis M.D.   On: 12/12/2015 09:12   Dg Hand Complete Left  Result Date: 12/12/2015 CLINICAL DATA:  Patient with left thumb pain radiating to the forearm for 2 weeks. No known injury. Initial encounter. EXAM: LEFT HAND - COMPLETE 3+ VIEW COMPARISON:  None. FINDINGS: There is no evidence of fracture or dislocation. There is no evidence of arthropathy or other focal bone abnormality. Soft tissues are unremarkable. IMPRESSION: No acute osseous abnormality. Electronically Signed   By: Annia Beltrew  Davis M.D.   On: 12/12/2015 09:11     Procedures Procedures (including critical care time)  Medications Ordered in ED Medications - No data to display   Initial Impression / Assessment and Plan / ED Course  I have reviewed the triage vital signs and the nursing notes.  Pertinent labs & imaging results that were available during my care of the patient were reviewed by me and considered in my medical decision making (see chart for details).  Clinical Course   This  is a 45 y.o. Female who presents to the emergency department complaining of pain from her left thumb that radiates to her left wrist for the past 2 weeks. She denies any injury or trauma to her hand or wrist. She is right hand dominant. 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 She denies any numbness, tingling or weakness. She reports pain is worse with range of motion. On exam patient is afebrile nontoxic appearing. She has mild tenderness  of her left MCP joint of the first digit as well as over the lateral aspect of her left wrist. Good range of motion of her digits and wrist. No deformity. No erythema or warmth. No ecchymosis. No snuff box tenderness. She is neurovascularly intact. X-rays of her left hand and wrist are unremarkable. Will place the patient in a wrist splint and have her follow-up with her primary care doctor. Advised if her symptoms persist she can follow up with hand surgeon Dr. Amanda Pea. Ice and naproxen for pain control. I advised the patient to follow-up with their primary care provider this week. I advised the patient to return to the emergency department with new or worsening symptoms or new concerns. The patient verbalized understanding and agreement with plan.      Final Clinical Impressions(s) / ED Diagnoses   Final diagnoses:  Left wrist pain    New Prescriptions Discharge Medication List as of 12/12/2015 10:02 AM    START taking these medications   Details  naproxen (NAPROSYN) 250 MG tablet Take 1 tablet  (250 mg total) by mouth 2 (two) times daily with a meal., Starting Sat 12/12/2015, Print         [name], PA-C 12/12/15 1025    Gwyneth Sprout, MD 12/12/15 2045

## 2016-01-16 ENCOUNTER — Encounter (HOSPITAL_COMMUNITY): Payer: Self-pay | Admitting: Nurse Practitioner

## 2016-01-16 DIAGNOSIS — K047 Periapical abscess without sinus: Secondary | ICD-10-CM | POA: Insufficient documentation

## 2016-01-16 DIAGNOSIS — Z79899 Other long term (current) drug therapy: Secondary | ICD-10-CM | POA: Insufficient documentation

## 2016-01-16 DIAGNOSIS — I10 Essential (primary) hypertension: Secondary | ICD-10-CM | POA: Insufficient documentation

## 2016-01-16 NOTE — ED Triage Notes (Signed)
Pt c/o right side lower molar dental pain. States she has been taking ibuprofen without getting any relief.

## 2016-01-17 ENCOUNTER — Emergency Department (HOSPITAL_COMMUNITY)
Admission: EM | Admit: 2016-01-17 | Discharge: 2016-01-17 | Disposition: A | Discharge status: Home / Self Care | Payer: Medicaid Other | Attending: Emergency Medicine | Admitting: Emergency Medicine

## 2016-01-17 DIAGNOSIS — K047 Periapical abscess without sinus: Secondary | ICD-10-CM

## 2016-01-17 MED ORDER — PENICILLIN V POTASSIUM 500 MG PO TABS: 500.0000 mg | ORAL_TABLET | Freq: Four times a day (QID) | ORAL | 0 refills | Status: AC

## 2016-01-17 MED ORDER — IBUPROFEN 600 MG PO TABS: 600.0000 mg | ORAL_TABLET | Freq: Four times a day (QID) | ORAL | 0 refills | Status: DC | PRN

## 2016-01-17 MED ORDER — HYDROCODONE-ACETAMINOPHEN 5-325 MG PO TABS
1.0000 | ORAL_TABLET | Freq: Once | ORAL | Status: AC
  Administered 2016-01-17: 1 via ORAL
  Filled 2016-01-17: qty 1

## 2016-01-17 MED ORDER — NAPROXEN 500 MG PO TABS
500.0000 mg | ORAL_TABLET | Freq: Once | ORAL | Status: AC
  Administered 2016-01-17: 500 mg via ORAL
  Filled 2016-01-17: qty 1

## 2016-01-17 MED ORDER — HYDROCODONE-ACETAMINOPHEN 5-325 MG PO TABS: 1.0000 | ORAL_TABLET | Freq: Three times a day (TID) | ORAL | 0 refills | Status: DC | PRN

## 2016-01-17 NOTE — ED Notes (Signed)
Patient is alert and oriented x3.  She was given DC instructions and follow up visit instructions.  Patient gave verbal understanding. She was DC ambulatory under her own power to home.  V/S stable.  He was not showing any signs of distress on DC 

## 2016-01-17 NOTE — ED Provider Notes (Signed)
[facility]-EMERGENCY DEPT Provider Note   CSN: [id number] Arrival date & time: 01/16/16  2228  By signing my name below, I, Connie Castaneda. Connie Castaneda, attest that this documentation has been prepared under the direction and in the presence of Connie Kaplan, MD.  Electronically Signed: Suzan Castaneda. Connie Castaneda, ED Scribe. 01/17/16. Connie Castaneda    History   Chief Complaint Chief Complaint  Patient presents with  . Dental Pain   The history is provided by the patient. No language interpreter was used.    HPI Comments: Connie Castaneda is a 45 y.o. female with a PMHx of HTN who presents to the Emergency Department complaining of constant, worsening R sided lower dental pain x 2 days. She also reports associated discomfort to the R side of her face. No aggravating or alleviating factors at this time. OTC Ibuprofen attempted at home without any improvement. No recent fever, chills, nausea, or vomiting. Denies any neck pain or back pain. Pt is established with a dentist but has been unable to make an appointment until Monday when office opens.  PCP: Connie Boise, MD    Past Medical History:  Diagnosis Date  . GERD (gastroesophageal reflux disease)   . Hypertension     There are no active problems to display for this patient.   Past Surgical History:  Procedure Laterality Date  . CESAREAN SECTION    . CHOLECYSTECTOMY    . FOOT SURGERY      OB History    No data available       Home Medications    Prior to Admission medications   Medication Sig Start Date End Date Taking? Authorizing Provider  albuterol (PROVENTIL HFA;VENTOLIN HFA) 108 (90 BASE) MCG/ACT inhaler Inhale 2 puffs into the lungs every 4 (four) hours as needed for wheezing. Patient not taking: Reported on 05/24/2014 07/08/13   Connie Good, PA-C  amLODipine (NORVASC) 10 MG tablet Take 1 tablet (10 mg total) by mouth daily. 05/24/14   Connie M Hess, PA-C  amLODipine-benazepril (LOTREL) 10-40 MG per capsule Take 1 capsule by mouth daily.    Historical  Provider, MD  guaiFENesin-codeine 100-10 MG/5ML syrup Take 10 mLs by mouth every 4 (four) hours as needed for cough. Patient not taking: Reported on 05/24/2014 07/08/13   Connie Good, PA-C  HYDROcodone-acetaminophen (NORCO/VICODIN) 5-325 MG tablet Take 1 tablet by mouth every 8 (eight) hours as needed. 01/17/16   Connie Kaplan, MD  ibuprofen (ADVIL,MOTRIN) 600 MG tablet Take 1 tablet (600 mg total) by mouth every 6 (six) hours as needed. 01/17/16   Connie Kaplan, MD  naproxen (NAPROSYN) 250 MG tablet Take 1 tablet (250 mg total) by mouth 2 (two) times daily with a meal. 12/12/15   Connie Farrier, PA-C  omeprazole (PRILOSEC) 40 MG capsule Take 1 capsule (40 mg total) by mouth daily. 05/24/14   Connie Speed, PA-C  penicillin v potassium (VEETID) 500 MG tablet Take 1 tablet (500 mg total) by mouth 4 (four) times daily. 01/17/16 01/24/16  Connie Kaplan, MD  traMADol (ULTRAM) 50 MG tablet Take 1 tablet (50 mg total) by mouth every 6 (six) hours as needed. Patient not taking: Reported on 05/24/2014 07/16/13   Connie Laine, MD    Family History History reviewed. No pertinent family history.  Social History Social History  Substance Use Topics  . Smoking status: Never Smoker  . Smokeless tobacco: Never Used  . Alcohol use No     Allergies   Review of patient's allergies indicates no known allergies.  Review of Systems Review of Systems  Constitutional: Negative for chills and fever.  HENT: Positive for dental problem.   Gastrointestinal: Negative for nausea and vomiting.  Musculoskeletal: Negative for back pain and neck pain.  All other systems reviewed and are negative.    Physical Exam Updated Vital Signs BP 173/93 (BP Location: Right Arm)   Pulse 78   Temp 98.6 F (37 C) (Oral)   Resp 14   Ht 5\' 6"  (1.676 m)   Wt 276 lb (125.2 kg)   SpO2 99%   BMI 44.55 kg/m   Physical Exam  Constitutional: She is oriented to person, place, and time. She appears well-developed and  well-nourished.  HENT:  Head: Normocephalic.  No trismus. Tooth number 32 is tender to palpation. No drooling or stridor. No gingival abscess appreciated one exam.  Eyes: EOM are normal.  Neck: Normal range of motion.  Pulmonary/Chest: Effort normal.  Abdominal: She exhibits no distension.  Musculoskeletal: Normal range of motion.  Lymphadenopathy:    She has no cervical adenopathy.  Neurological: She is alert and oriented to person, place, and time.  Psychiatric: She has a normal mood and affect.  Nursing note and vitals reviewed.    ED Treatments / Results   DIAGNOSTIC STUDIES: Oxygen Saturation is 99% on RA, Normal by my interpretation.    COORDINATION OF CARE: 2:12 AM- Will give dose of pain medication. Discussed treatment plan with pt at bedside and pt agreed to plan.     Labs (all labs ordered are listed, but only abnormal results are displayed) Labs Reviewed - No data to display  EKG  EKG Interpretation None       Radiology No results found.  Procedures Procedures (including critical care time)  Medications Ordered in ED Medications  naproxen (NAPROSYN) tablet 500 mg (not administered)  HYDROcodone-acetaminophen (NORCO/VICODIN) 5-325 MG per tablet 1 tablet (not administered)     Initial Impression / Assessment and Plan / ED Course  I have reviewed the triage vital signs and the nursing notes.  Pertinent labs & imaging results that were available during my care of the patient were reviewed by me and considered in my medical decision making (see chart for details).  Clinical Course      Final Clinical Impressions(s) / ED Diagnoses   Final diagnoses:  Dental abscess   DDx includes: - Periapical tooth infection - Dental abscess - Gingivitis - Dental trauma - Pulpitis - Nerve root compression  Seems like a dental abscess. Will d/c.  New Prescriptions New Prescriptions   HYDROCODONE-ACETAMINOPHEN (NORCO/VICODIN) 5-325 MG TABLET    Take 1  tablet by mouth every 8 (eight) hours as needed.   IBUPROFEN (ADVIL,MOTRIN) 600 MG TABLET    Take 1 tablet (600 mg total) by mouth every 6 (six) hours as needed.   PENICILLIN V POTASSIUM (VEETID) 500 MG TABLET    Take 1 tablet (500 mg total) by mouth 4 (four) times daily.      Connie KaplanAnkit Tameaka Eichhorn, MD 01/17/16 [phone]

## 2016-01-17 NOTE — ED Notes (Signed)
Bed: WA03 Expected date:  Expected time:  Means of arrival:  Comments: 

## 2016-01-17 NOTE — Discharge Instructions (Signed)
We saw you in the ER for the toothache. °No evidence of deep infection, no evidence of pus that can be drained out. °YOU WILL NEED TO SEE A DENTIST to get appropriate care. °Please take the antibiotics and pain meds provided in the interval. ° °

## 2017-03-21 ENCOUNTER — Encounter (HOSPITAL_COMMUNITY): Payer: Self-pay

## 2017-03-21 ENCOUNTER — Emergency Department (HOSPITAL_COMMUNITY)
Admission: EM | Admit: 2017-03-21 | Discharge: 2017-03-21 | Disposition: A | Discharge status: Home / Self Care | Payer: Self-pay | Attending: Emergency Medicine | Admitting: Emergency Medicine

## 2017-03-21 DIAGNOSIS — R42 Dizziness and giddiness: Secondary | ICD-10-CM | POA: Insufficient documentation

## 2017-03-21 DIAGNOSIS — R110 Nausea: Secondary | ICD-10-CM | POA: Insufficient documentation

## 2017-03-21 DIAGNOSIS — R000 Tachycardia, unspecified: Secondary | ICD-10-CM | POA: Insufficient documentation

## 2017-03-21 DIAGNOSIS — Z79899 Other long term (current) drug therapy: Secondary | ICD-10-CM | POA: Insufficient documentation

## 2017-03-21 DIAGNOSIS — I10 Essential (primary) hypertension: Secondary | ICD-10-CM | POA: Insufficient documentation

## 2017-03-21 LAB — CBC WITH DIFFERENTIAL/PLATELET
BASOS ABS: 0 10*3/uL (ref 0.0–0.1)
Basophils Relative: 0 %
EOS PCT: 1 %
Eosinophils Absolute: 0.1 10*3/uL (ref 0.0–0.7)
HCT: 38.9 % (ref 36.0–46.0)
Hemoglobin: 13.3 g/dL (ref 12.0–15.0)
LYMPHS ABS: 2.8 10*3/uL (ref 0.7–4.0)
LYMPHS PCT: 35 %
MCH: 29.5 pg (ref 26.0–34.0)
MCHC: 34.2 g/dL (ref 30.0–36.0)
MCV: 86.3 fL (ref 78.0–100.0)
MONO ABS: 0.5 10*3/uL (ref 0.1–1.0)
MONOS PCT: 6 %
NEUTROS ABS: 4.6 10*3/uL (ref 1.7–7.7)
Neutrophils Relative %: 58 %
Platelets: 233 10*3/uL (ref 150–400)
RBC: 4.51 MIL/uL (ref 3.87–5.11)
RDW: 12.9 % (ref 11.5–15.5)
WBC: 7.9 10*3/uL (ref 4.0–10.5)

## 2017-03-21 LAB — URINALYSIS, ROUTINE W REFLEX MICROSCOPIC
Bilirubin Urine: NEGATIVE
GLUCOSE, UA: NEGATIVE mg/dL
HGB URINE DIPSTICK: NEGATIVE
Ketones, ur: NEGATIVE mg/dL
LEUKOCYTES UA: NEGATIVE
Nitrite: NEGATIVE
PH: 5 (ref 5.0–8.0)
PROTEIN: NEGATIVE mg/dL
Specific Gravity, Urine: 1.017 (ref 1.005–1.030)

## 2017-03-21 LAB — LIPASE, BLOOD: Lipase: 26 U/L (ref 11–51)

## 2017-03-21 LAB — COMPREHENSIVE METABOLIC PANEL
ALBUMIN: 3.7 g/dL (ref 3.5–5.0)
ALT: 17 U/L (ref 14–54)
AST: 26 U/L (ref 15–41)
Alkaline Phosphatase: 56 U/L (ref 38–126)
Anion gap: 9 (ref 5–15)
BILIRUBIN TOTAL: 0.9 mg/dL (ref 0.3–1.2)
BUN: 14 mg/dL (ref 6–20)
CO2: 24 mmol/L (ref 22–32)
Calcium: 8.9 mg/dL (ref 8.9–10.3)
Chloride: 103 mmol/L (ref 101–111)
Creatinine, Ser: 0.66 mg/dL (ref 0.44–1.00)
GFR calc Af Amer: >60 mL/min (ref >60)
GFR calc non Af Amer: >60 mL/min (ref >60)
GLUCOSE: 98 mg/dL (ref 65–99)
POTASSIUM: 3.6 mmol/L (ref 3.5–5.1)
SODIUM: 136 mmol/L (ref 135–145)
TOTAL PROTEIN: 8 g/dL (ref 6.5–8.1)

## 2017-03-21 LAB — I-STAT TROPONIN, ED
TROPONIN I, POC: 0 ng/mL (ref 0.00–0.08)
Troponin i, poc: 0 ng/mL (ref 0.00–0.08)

## 2017-03-21 LAB — RAPID URINE DRUG SCREEN, HOSP PERFORMED
AMPHETAMINES: NOT DETECTED
BARBITURATES: NOT DETECTED
BENZODIAZEPINES: NOT DETECTED
COCAINE: NOT DETECTED
OPIATES: NOT DETECTED
TETRAHYDROCANNABINOL: NOT DETECTED

## 2017-03-21 LAB — D-DIMER, QUANTITATIVE: D-Dimer, Quant: <0.27 ug/mL-FEU (ref 0.00–0.50)

## 2017-03-21 LAB — I-STAT BETA HCG BLOOD, ED (MC, WL, AP ONLY): I-stat hCG, quantitative: <5 m[IU]/mL (ref <5)

## 2017-03-21 MED ORDER — MECLIZINE HCL 25 MG PO TABS: 25.0000 mg | ORAL_TABLET | Freq: Three times a day (TID) | ORAL | 0 refills | Status: DC | PRN

## 2017-03-21 MED ORDER — SODIUM CHLORIDE 0.9 % IV BOLUS (SEPSIS)
1000.0000 mL | Freq: Once | INTRAVENOUS | Status: AC
  Administered 2017-03-21: 1000 mL via INTRAVENOUS

## 2017-03-21 NOTE — ED Triage Notes (Signed)
Pt reports that she has been having dizziness intermittently since Friday, pt reports that she feels as if she may pass out/or fall. Pt has HX of HTN .

## 2017-03-21 NOTE — ED Notes (Signed)
ED Provider at bedside. 

## 2017-03-21 NOTE — ED Provider Notes (Signed)
[facility]-EMERGENCY DEPT Provider Note   CSN: [id number] Arrival date & time: 03/21/17  1104     History   Chief Complaint Chief Complaint  Patient presents with  . Dizziness    HPI Connie Castaneda Castaneda is a 46 y.o. female.  HPI   Connie Castaneda is a 46 y.o. female, with a history of HTN and GERD, presenting to the ED with dizziness for the last 4 days. States, "It makes me feel like I'm on a boat in the ocean." States she was walking out to her car from the house when the first episode occurred. Sat down and sensation resolved after a couple minutes. Improves when she puts her chin to her chest or when she closes her eyes. Has not encountered these episodes before.  Accompanied by nausea. Mentions "worsening of my heart burn" over the past few days. Pain is burning, starts in upper central chest, radiates to epigastric region. Resolves with Zantac.  Also notes some episodic, exertional "heavy and fast breathing" over the past couple days.  No recent medication changes.  Denies fever/chills, cough, recent illness, vomiting/diarrhea, trauma/falls, vision abnormalities, headache, neck pain, neuro deficits, or any other complaints.   Denies history of PE/DVT, recent trauma, surgeries, cancer, immobilization, or hormonal therapy.   Past Medical History:  Diagnosis Date  . GERD (gastroesophageal reflux disease)   . Hypertension     There are no active problems to display for this patient.   Past Surgical History:  Procedure Laterality Date  . CESAREAN SECTION    . CHOLECYSTECTOMY    . FOOT SURGERY      OB History    No data available       Home Medications    Prior to Admission medications   Medication Sig Start Date End Date Taking? Authorizing Provider  amLODipine (NORVASC) 10 MG tablet Take 1 tablet (10 mg total) by mouth daily. 05/24/14  Yes Hess, Robyn M, PA-C  ibuprofen (ADVIL,MOTRIN) 600 MG tablet Take 1 tablet (600 mg total) by mouth every 6 (six)  hours as needed. 01/17/16  Yes Nanavati, Ankit, MD  losartan-hydrochlorothiazide (HYZAAR) 100-25 MG tablet Take 1 tablet by mouth daily.   Yes [provider]  albuterol (PROVENTIL HFA;VENTOLIN HFA) 108 (90 BASE) MCG/ACT inhaler Inhale 2 puffs into the lungs every 4 (four) hours as needed for wheezing. Patient not taking: Reported on 05/24/2014 07/08/13   Graylon GoodBaker, Zachary H, PA-C  guaiFENesin-codeine 100-10 MG/5ML syrup Take 10 mLs by mouth every 4 (four) hours as needed for cough. Patient not taking: Reported on 05/24/2014 07/08/13   Graylon GoodBaker, Zachary H, PA-C  HYDROcodone-acetaminophen (NORCO/VICODIN) 5-325 MG tablet Take 1 tablet by mouth every 8 (eight) hours as needed. Patient not taking: Reported on 03/21/2017 01/17/16   Derwood KaplanNanavati, Ankit, MD  meclizine (ANTIVERT) 25 MG tablet Take 1 tablet (25 mg total) by mouth 3 (three) times daily as needed for dizziness. 03/21/17   Roann Merk C, PA-C  naproxen (NAPROSYN) 250 MG tablet Take 1 tablet (250 mg total) by mouth 2 (two) times daily with a meal. Patient not taking: Reported on 03/21/2017 12/12/15   Everlene Farrieransie, William, PA-C  omeprazole (PRILOSEC) 40 MG capsule Take 1 capsule (40 mg total) by mouth daily. Patient not taking: Reported on 03/21/2017 05/24/14   Kathrynn SpeedHess, Robyn M, PA-C  traMADol (ULTRAM) 50 MG tablet Take 1 tablet (50 mg total) by mouth every 6 (six) hours as needed. Patient not taking: Reported on 05/24/2014 07/16/13   Cathren LaineSteinl, Kevin, MD  Family History History reviewed. No pertinent family history.  Social History Social History   Tobacco Use  . Smoking status: Never Smoker  . Smokeless tobacco: Never Used  Substance Use Topics  . Alcohol use: No  . Drug use: No     Allergies   Patient has no known allergies.   Review of Systems Review of Systems  Constitutional: Negative for chills, diaphoresis and fever.  Eyes: Negative for visual disturbance.  Respiratory: Negative for cough and shortness of breath.   Cardiovascular:  Negative for palpitations and leg swelling.  Gastrointestinal: Positive for nausea. Negative for abdominal pain, diarrhea and vomiting.  Musculoskeletal: Negative for back pain and neck pain.  Neurological: Positive for dizziness. Negative for syncope, weakness, numbness and headaches.  All other systems reviewed and are negative.    Physical Exam Updated Vital Signs BP (!) 157/104 (BP Location: Right Arm)   Pulse (!) 106   Temp 97.6 F (36.4 C) (Oral)   Resp 20   Ht 5\' 6"  (1.676 m)   Wt 122.5 kg (270 lb)   LMP 02/25/2017   SpO2 100%   BMI 43.58 kg/m   Physical Exam  Constitutional: She is oriented to person, place, and time. She appears well-developed and well-nourished. No distress.  HENT:  Head: Normocephalic and atraumatic.  Mouth/Throat: Oropharynx is clear and moist.  Eyes: Conjunctivae and EOM are normal. Pupils are equal, round, and reactive to light.  Neck: Neck supple.  Cardiovascular: Regular rhythm, normal heart sounds and intact distal pulses. Tachycardia present.  Pulmonary/Chest: Effort normal and breath sounds normal. No respiratory distress.  No increased work of breathing.  Patient speaks in full sentences without difficulty.  Abdominal: Soft. There is no tenderness. There is no guarding.  Musculoskeletal: She exhibits no edema.  Lymphadenopathy:    She has no cervical adenopathy.  Neurological: She is alert and oriented to person, place, and time.  No onset of symptoms with change in position or turning head. No sensory deficits.  No noted speech deficits. No aphasia. Patient handles oral secretions without difficulty. No noted swallowing defects.  Equal grip strength bilaterally. Strength 5/5 in the upper extremities. Strength 5/5 with flexion and extension of the hips, knees, and ankles bilaterally.  Patellar DTRs 2+ bilaterally. Negative Romberg. No gait disturbance.  Coordination intact including heel to shin and finger to nose.  Cranial nerves  III-XII grossly intact.  No facial droop.   Skin: Skin is warm and dry. Capillary refill takes less than 2 seconds. She is not diaphoretic.  Psychiatric: She has a normal mood and affect. Her behavior is normal.  Nursing note and vitals reviewed.    ED Treatments / Results  Labs (all labs ordered are listed, but only abnormal results are displayed) Labs Reviewed  COMPREHENSIVE METABOLIC PANEL  URINALYSIS, ROUTINE W REFLEX MICROSCOPIC  CBC WITH DIFFERENTIAL/PLATELET  LIPASE, BLOOD  RAPID URINE DRUG SCREEN, HOSP PERFORMED  D-DIMER, QUANTITATIVE (NOT AT [facility])  ETHANOL  I-STAT BETA HCG BLOOD, ED ([facility], [facility], [facility] ONLY)  I-STAT TROPONIN, ED  I-STAT TROPONIN, ED    EKG  EKG Interpretation None       Radiology No results found.  Procedures Procedures (including critical care time)  Medications Ordered in ED Medications  sodium chloride 0.9 % bolus 1,000 mL (0 mLs Intravenous Stopped 03/21/17 1739)     Initial Impression / Assessment and Plan / ED Course  I have reviewed the triage vital signs and the nursing notes.  Pertinent labs & imaging  results that were available during my care of the patient were reviewed by me and considered in my medical decision making (see chart for details).  Clinical Course as of Mar 21 2038  Tue Mar 21, 2017  1745 Discussed lab results with the patient.  Patient states she feels well.  [SJ]  1931 Patient continues to be symptom-free and is resting comfortably on the bed.  [SJ]    Clinical Course User Index [SJ] Thy Gullikson C, PA-C    Patient presents with episodic dizziness.  Episodes seem to be connected with changes in position.  No acute abnormalities on patient's lab work.  Delta troponins negative.  D-dimer negative.  EKG sinus rhythm without ectopics.  Patient had none of her symptoms during ED course.  Following IV fluids, she was able to ambulate without recurrence of any of her symptoms.  Tachycardia resolved with IV fluids.  PCP  follow-up on this matter. The patient was given instructions for home care as well as return precautions. Patient voices understanding of these instructions, accepts the plan, and is comfortable with discharge.  Vitals:   03/21/17 1112 03/21/17 1115 03/21/17 1553 03/21/17 1902  BP: (!) 157/104  (!) 146/77 137/76  Pulse: (!) 106  93 71  Resp: 20  18 20   Temp: 97.6 F (36.4 C)     TempSrc: Oral     SpO2: 100%  99% 97%  Weight:  122.5 kg (270 lb)    Height:  5\' 6"  (1.676 m)       Final Clinical Impressions(s) / ED Diagnoses   Final diagnoses:  Dizziness    ED Discharge Orders        Ordered    meclizine (ANTIVERT) 25 MG tablet  3 times daily PRN     03/21/17 1920       Concepcion Living 03/21/17 2039    Mancel Bale, MD 03/23/17 [phone]

## 2017-03-21 NOTE — Discharge Instructions (Addendum)
Your lab results were reassuring. Zantac: Take the Zantac daily for the next 5 days. Prilosec: Begin taking the Prilosec again daily. Meclizine: Take the meclizine as needed for episodes of dizziness. Follow-up: Follow-up with your primary care provider on this matter. Return: Return to the ED for any worsening symptoms.

## 2017-03-21 NOTE — ED Notes (Signed)
Patient ambulated without difficulty. 

## 2017-03-21 NOTE — ED Notes (Signed)
PT currently in restroom will provide urine sample

## 2017-10-06 ENCOUNTER — Encounter (HOSPITAL_COMMUNITY): Payer: Self-pay | Admitting: Emergency Medicine

## 2017-10-06 ENCOUNTER — Emergency Department (HOSPITAL_COMMUNITY)
Admission: EM | Admit: 2017-10-06 | Discharge: 2017-10-06 | Disposition: A | Discharge status: Home / Self Care | Payer: Medicaid Other | Attending: Emergency Medicine | Admitting: Emergency Medicine

## 2017-10-06 ENCOUNTER — Other Ambulatory Visit: Payer: Self-pay

## 2017-10-06 DIAGNOSIS — Z79899 Other long term (current) drug therapy: Secondary | ICD-10-CM | POA: Insufficient documentation

## 2017-10-06 DIAGNOSIS — B9689 Other specified bacterial agents as the cause of diseases classified elsewhere: Secondary | ICD-10-CM

## 2017-10-06 DIAGNOSIS — B3731 Acute candidiasis of vulva and vagina: Secondary | ICD-10-CM

## 2017-10-06 DIAGNOSIS — B373 Candidiasis of vulva and vagina: Secondary | ICD-10-CM

## 2017-10-06 DIAGNOSIS — N760 Acute vaginitis: Secondary | ICD-10-CM

## 2017-10-06 DIAGNOSIS — I10 Essential (primary) hypertension: Secondary | ICD-10-CM | POA: Insufficient documentation

## 2017-10-06 DIAGNOSIS — N898 Other specified noninflammatory disorders of vagina: Secondary | ICD-10-CM

## 2017-10-06 LAB — WET PREP, GENITAL
Sperm: NONE SEEN
Trich, Wet Prep: NONE SEEN
Yeast Wet Prep HPF POC: NONE SEEN

## 2017-10-06 LAB — POC URINE PREG, ED: PREG TEST UR: NEGATIVE

## 2017-10-06 MED ORDER — FLUCONAZOLE 150 MG PO TABS: 150.0000 mg | ORAL_TABLET | Freq: Once | ORAL | 0 refills | Status: AC

## 2017-10-06 MED ORDER — FLUCONAZOLE 150 MG PO TABS
150.0000 mg | ORAL_TABLET | Freq: Once | ORAL | Status: AC
  Administered 2017-10-06: 150 mg via ORAL
  Filled 2017-10-06: qty 1

## 2017-10-06 MED ORDER — METRONIDAZOLE 500 MG PO TABS: 500.0000 mg | ORAL_TABLET | Freq: Two times a day (BID) | ORAL | 0 refills | Status: DC

## 2017-10-06 NOTE — ED Provider Notes (Signed)
[facility]-EMERGENCY DEPT Provider Note   CSN: [id number] Arrival date & time: 10/06/17  1416     History   Chief Complaint Chief Complaint  Patient presents with  . Vaginal Discharge    HPI Connie Castaneda is a 47 y.o. female with a PMHx of GERD and HTN, with a PSHx of cholecystectomy, who presents to the ED with complaints of mild amount of white thick fishy smelling vaginal discharge and vaginal itching for the last 3 days.  She has not tried anything for it, no known aggravating factors.  She states that she has had yeast infections before and this feels somewhat like it, but she is not sure whether she could also have bacterial vaginosis which she is also had in the past.  She denies any recent antibiotic use, changes in feminine products or soaps, douching, changes in condom brand, or any other recent changes.  Her LMP was in June, approximately 2 weeks ago, she states that they have been irregular since January and she is not sure whether she is going through "the change".  She is sexually active with one female partner, unprotected.  She denies fevers, chills, CP, SOB, abd pain, N/V/D/C, hematuria, dysuria, urinary frequency/urgency, malodorous urine, vaginal bleeding, genital sores, myalgias, arthralgias, numbness, tingling, focal weakness, or any other complaints at this time.   The history is provided by the patient and medical records. No language interpreter was used.  Vaginal Discharge   Pertinent negatives include no fever, no abdominal pain, no constipation, no diarrhea, no nausea, no vomiting, no dysuria and no frequency.    Past Medical History:  Diagnosis Date  . GERD (gastroesophageal reflux disease)   . Hypertension     There are no active problems to display for this patient.   Past Surgical History:  Procedure Laterality Date  . CESAREAN SECTION    . CHOLECYSTECTOMY    . FOOT SURGERY       OB History   None      Home Medications     Prior to Admission medications   Medication Sig Start Date End Date Taking? Authorizing Provider  albuterol (PROVENTIL HFA;VENTOLIN HFA) 108 (90 BASE) MCG/ACT inhaler Inhale 2 puffs into the lungs every 4 (four) hours as needed for wheezing. Patient not taking: Reported on 05/24/2014 07/08/13   [name], PA-C  amLODipine (NORVASC) 10 MG tablet Take 1 tablet (10 mg total) by mouth daily. 05/24/14   Hess, Nada Boozer, PA-C  guaiFENesin-codeine 100-10 MG/5ML syrup Take 10 mLs by mouth every 4 (four) hours as needed for cough. Patient not taking: Reported on 05/24/2014 07/08/13   [name], PA-C  HYDROcodone-acetaminophen (NORCO/VICODIN) 5-325 MG tablet Take 1 tablet by mouth every 8 (eight) hours as needed. Patient not taking: Reported on 03/21/2017 01/17/16   Derwood Kaplan, MD  ibuprofen (ADVIL,MOTRIN) 600 MG tablet Take 1 tablet (600 mg total) by mouth every 6 (six) hours as needed. 01/17/16   Derwood Kaplan, MD  losartan-hydrochlorothiazide (HYZAAR) 100-25 MG tablet Take 1 tablet by mouth daily.    [provider]  meclizine (ANTIVERT) 25 MG tablet Take 1 tablet (25 mg total) by mouth 3 (three) times daily as needed for dizziness. 03/21/17   Joy, Shawn C, PA-C  naproxen (NAPROSYN) 250 MG tablet Take 1 tablet (250 mg total) by mouth 2 (two) times daily with a meal. Patient not taking: Reported on 03/21/2017 12/12/15   [name], PA-C  omeprazole (PRILOSEC) 40 MG capsule Take 1  capsule (40 mg total) by mouth daily. Patient not taking: Reported on 03/21/2017 05/24/14   Kathrynn SpeedHess, Robyn M, PA-C  traMADol (ULTRAM) 50 MG tablet Take 1 tablet (50 mg total) by mouth every 6 (six) hours as needed. Patient not taking: Reported on 05/24/2014 07/16/13   Cathren LaineSteinl, Kevin, MD    Family History No family history on file.  Social History Social History   Tobacco Use  . Smoking status: Never Smoker  . Smokeless tobacco: Never Used  Substance Use Topics  . Alcohol use: No  . Drug use: No      Allergies   Patient has no known allergies.   Review of Systems Review of Systems  Constitutional: Negative for chills and fever.  Respiratory: Negative for shortness of breath.   Cardiovascular: Negative for chest pain.  Gastrointestinal: Negative for abdominal pain, constipation, diarrhea, nausea and vomiting.  Genitourinary: Positive for vaginal discharge and vaginal pain (itching). Negative for dysuria, frequency, genital sores, hematuria, urgency and vaginal bleeding.       No malodorous urine  Musculoskeletal: Negative for arthralgias and myalgias.  Skin: Negative for color change.  Allergic/Immunologic: Negative for immunocompromised state.  Neurological: Negative for weakness and numbness.  Psychiatric/Behavioral: Negative for confusion.   All other systems reviewed and are negative for acute change except as noted in the HPI.    Physical Exam Updated Vital Signs BP (!) 161/82 (BP Location: Left Arm)   Pulse 94   Temp 98.3 F (36.8 C) (Oral)   Resp 18   LMP 07/03/2017   SpO2 100%   Physical Exam  Constitutional: She is oriented to person, place, and time. Vital signs are normal. She appears well-developed and well-nourished.  Non-toxic appearance. No distress.  Afebrile, nontoxic, NAD  HENT:  Head: Normocephalic and atraumatic.  Mouth/Throat: Oropharynx is clear and moist and mucous membranes are normal.  Eyes: Conjunctivae and EOM are normal. Right eye exhibits no discharge. Left eye exhibits no discharge.  Neck: Normal range of motion. Neck supple.  Cardiovascular: Normal rate, regular rhythm, normal heart sounds and intact distal pulses. Exam reveals no gallop and no friction rub.  No murmur heard. Pulmonary/Chest: Effort normal and breath sounds normal. No respiratory distress. She has no decreased breath sounds. She has no wheezes. She has no rhonchi. She has no rales.  Abdominal: Soft. Normal appearance and bowel sounds are normal. She exhibits no  distension. There is no tenderness. There is no rigidity, no rebound, no guarding, no CVA tenderness, no tenderness at McBurney's point and negative Murphy's sign.  Soft, NTND, +BS throughout, no r/g/r, neg murphy's, neg mcburney's, no CVA TTP   Genitourinary: Uterus normal. Pelvic exam was performed with patient supine. There is no rash, tenderness or lesion on the right labia. There is no rash, tenderness or lesion on the left labia. Cervix exhibits no motion tenderness, no discharge and no friability. Right adnexum displays no mass, no tenderness and no fullness. Left adnexum displays no mass, no tenderness and no fullness. No erythema, tenderness or bleeding in the vagina. Vaginal discharge found.  Genitourinary Comments: Chaperone present for exam. No rashes, lesions, or tenderness to external genitalia. Some slight irritation to vaginal mucosa, but no erythema, injury, or tenderness to vaginal mucosa. Very scant greyish white vaginal discharge without bleeding within vaginal vault. No adnexal masses, tenderness, or fullness. No CMT, cervical friability, or discharge from cervical os. Cervical os is closed. Uterus non-deviated, mobile, nonTTP, and without enlargement.    Musculoskeletal: Normal range of motion.  Neurological: She is alert and oriented to person, place, and time. She has normal strength. No sensory deficit.  Skin: Skin is warm, dry and intact. No rash noted.  Psychiatric: She has a normal mood and affect.  Nursing note and vitals reviewed.    ED Treatments / Results  Labs (all labs ordered are listed, but only abnormal results are displayed) Labs Reviewed  WET PREP, GENITAL - Abnormal; Notable for the following components:      Result Value   Clue Cells Wet Prep HPF POC PRESENT (*)    WBC, Wet Prep HPF POC FEW (*)    All other components within normal limits  RPR  HIV ANTIBODY (ROUTINE TESTING)  POC URINE PREG, ED  GC/CHLAMYDIA PROBE AMP ([place]) NOT AT [facility]     EKG None  Radiology No results found.  Procedures Procedures (including critical care time)  Medications Ordered in ED Medications  fluconazole (DIFLUCAN) tablet 150 mg (150 mg Oral Given 10/06/17 1803)     Initial Impression / Assessment and Plan / ED Course  I have reviewed the triage vital signs and the nursing notes.  Pertinent labs & imaging results that were available during my care of the patient were reviewed by me and considered in my medical decision making (see chart for details).     47 y.o. female here with vaginal discharge and itching x3 days. No other associated symptoms, no urinary symptoms or abdominal pain. On exam, no abdominal tenderness, afebrile and nontoxic. Upreg neg today. Will proceed with pelvic exam at pt's request, and get STD testing and wet prep. Will reassess once that's done. Doubt need for other labs/imaging for now, will reassess after pelvic.  5:52 PM Pelvic exam reveals scant amount of greyish white vaginal discharge and some mucosal irritation, otherwise unremarkable. Will empirically treat for yeast vaginitis given symptoms, and await wet prep; I anticipate she will also need tx for BV; doubt need for empiric tx of GC/CT at this time. Will reassess shortly  7:21 PM Wet prep shows +clue cells, few WBCs. Exam and lab findings c/w BV and likely yeast. Pt empirically treated for yeast here, will send home with second dose of diflucan to take after finishing flagyl. Will send home with flagyl for BV. Doubt need for further emergent work up at this time. Doubt need for empiric tx of GC/CT. Discussed abstinence until STD results return. F/up with health dept for future STD concerns. Safe sex encouraged, and discussed having partners tested and treated before re-engaging in intercourse after abstinence period. F/up with her PCP in 1wk for recheck of symptoms. I explained the diagnosis and have given explicit precautions to return to the ER including for  any other new or worsening symptoms. The patient understands and accepts the medical plan as it's been dictated and I have answered their questions. Discharge instructions concerning home care and prescriptions have been given. The patient is STABLE and is discharged to home in good condition.     Final Clinical Impressions(s) / ED Diagnoses   Final diagnoses:  Yeast vaginitis  Bacterial vaginosis  Vaginal discharge    ED Discharge Orders        Ordered    fluconazole (DIFLUCAN) 150 MG tablet   Once     10/06/17 1824    metroNIDAZOLE (FLAGYL) 500 MG tablet  2 times daily     10/06/17 [address] [place], [place] 10/06/17 1921    Little, Ambrose Finland,  MD 10/08/17 1721

## 2017-10-06 NOTE — ED Notes (Signed)
Pelvic cart at bedside. 

## 2017-10-06 NOTE — Discharge Instructions (Addendum)
You have been treated for yeast infection in the ER but take the second dose of diflucan after finishing the flagyl. Take flagyl as directed to treat bacterial vaginosis; don't drink alcohol while taking this medication. Avoid harsh soaps/douching/etc. You were tested for HIV, Syphilis, Gonorrhea, and Chlamydia in the ER, the hospital will call you if the lab is positive. DO NOT ENGAGE IN SEXUAL ACTIVITY UNTIL YOU FIND OUT ABOUT YOUR RESULTS AND HAVE PARTNERS TESTED AND TREATED. ALL PARTNERS MUST BE TESTED AND TREATED FOR STD'S. ALWAYS USE CONDOMS WHEN ENGAGING IN INTERCOURSE. [facility] for future STD concerns or screenings. Follow up with your regular doctor in 1 week for recheck of symptoms. [facility] (called [facility]) for any changes or worsening symptoms.

## 2017-10-06 NOTE — ED Triage Notes (Signed)
Vaginal discharge and itching since Tuesday; unable to get an appointment with PCP or health department.

## 2017-10-07 LAB — HIV ANTIBODY (ROUTINE TESTING W REFLEX): HIV Screen 4th Generation wRfx: NONREACTIVE

## 2017-10-07 LAB — RPR: RPR Ser Ql: NONREACTIVE

## 2017-10-09 LAB — GC/CHLAMYDIA PROBE AMP (~~LOC~~) NOT AT ARMC
Chlamydia: NEGATIVE
NEISSERIA GONORRHEA: NEGATIVE

## 2019-06-14 ENCOUNTER — Telehealth: Payer: Medicaid Other | Admitting: Family

## 2019-06-14 DIAGNOSIS — R21 Rash and other nonspecific skin eruption: Secondary | ICD-10-CM

## 2019-06-14 MED ORDER — TRIAMCINOLONE ACETONIDE 0.1 % EX CREA: 1.0000 "application " | TOPICAL_CREAM | Freq: Two times a day (BID) | CUTANEOUS | 0 refills | Status: DC

## 2019-06-14 NOTE — Progress Notes (Signed)
E Visit for Rash  We are sorry that you are not feeling well. Here is how we plan to help!  Based on what you shared with me it looks like you have contact dermatitis.  Contact dermatitis is a skin rash caused by something that touches the skin and causes irritation or inflammation.  Your skin may be red, swollen, dry, cracked, and itch.  The rash should go away in a few days but can last a few weeks.  If you get a rash, it's important to figure out what caused it so the irritant can be avoided in the future. I have prescribed Kenalog cream 0.1% that you will apply twice a day. Avoid applying to your face or groin.     HOME CARE:   Take cool showers and avoid direct sunlight.  Apply cool compress or wet dressings.  Take a bath in an oatmeal bath.  Sprinkle content of one Aveeno packet under running faucet with comfortably warm water.  Bathe for 15-20 minutes, 1-2 times daily.  Pat dry with a towel. Do not rub the rash.  Use hydrocortisone cream.  Take an antihistamine like Benadryl for widespread rashes that itch.  The adult dose of Benadryl is 25-50 mg by mouth 4 times daily.  Caution:  This type of medication may cause sleepiness.  Do not drink alcohol, drive, or operate dangerous machinery while taking antihistamines.  Do not take these medications if you have prostate enlargement.  Read package instructions thoroughly on all medications that you take.  GET HELP RIGHT AWAY IF:   Symptoms don't go away after treatment.  Severe itching that persists.  If you rash spreads or swells.  If you rash begins to smell.  If it blisters and opens or develops a yellow-brown crust.  You develop a fever.  You have a sore throat.  You become short of breath.  MAKE SURE YOU:  Understand these instructions. Will watch your condition. Will get help right away if you are not doing well or get worse.  Thank you for choosing an e-visit. Your e-visit answers were reviewed by a board  certified advanced clinical practitioner to complete your personal care plan. Depending upon the condition, your plan could have included both over the counter or prescription medications. Please review your pharmacy choice. Be sure that the pharmacy you have chosen is open so that you can pick up your prescription now.  If there is a problem you may message your provider in MyChart to have the prescription routed to another pharmacy. Your safety is important to [place]. If you have drug allergies check your prescription carefully.  For the next 24 hours, you can use MyChart to ask questions about today's visit, request a non-urgent call back, or ask for a work or school excuse from your e-visit provider. You will get an email in the next two days asking about your experience. I hope that your e-visit has been valuable and will speed your recovery.  Approximately 5 minutes was spent documenting and reviewing patient's chart.

## 2019-06-23 ENCOUNTER — Telehealth: Payer: Medicaid Other | Admitting: Physician Assistant

## 2019-06-23 DIAGNOSIS — N760 Acute vaginitis: Secondary | ICD-10-CM

## 2019-06-23 MED ORDER — FLUCONAZOLE 150 MG PO TABS: 150.0000 mg | ORAL_TABLET | Freq: Once | ORAL | 0 refills | Status: AC

## 2019-06-23 NOTE — Progress Notes (Signed)

## 2019-08-14 ENCOUNTER — Telehealth: Payer: Medicaid Other | Admitting: Nurse Practitioner

## 2019-08-14 DIAGNOSIS — N3000 Acute cystitis without hematuria: Secondary | ICD-10-CM

## 2019-08-14 MED ORDER — CEPHALEXIN 500 MG PO CAPS: 500.0000 mg | ORAL_CAPSULE | Freq: Two times a day (BID) | ORAL | 0 refills | Status: DC

## 2019-08-14 MED ORDER — FLUCONAZOLE 150 MG PO TABS: 150.0000 mg | ORAL_TABLET | Freq: Once | ORAL | 0 refills | Status: AC

## 2019-08-14 NOTE — Progress Notes (Signed)
We are sorry that you are not feeling well.  Here is how we plan to help!  Based on what you shared with me it looks like you most likely have a simple urinary tract infection.  A UTI (Urinary Tract Infection) is a bacterial infection of the bladder.  Most cases of urinary tract infections are simple to treat but a key part of your care is to encourage you to drink plenty of fluids and watch your symptoms carefully.  I have prescribed Keflex 500 mg twice a day for 7 days a well a diflucan 150mg  if yeast infection develops.  Your symptoms should gradually improve. Call if the burning in your urine worsens, you develop worsening fever, back pain or pelvic pain or if your symptoms do not resolve after completing the antibiotic.  Urinary tract infections can be prevented by drinking plenty of water to keep your body hydrated.  Also be sure when you wipe, wipe from front to back and don't hold it in!  If possible, empty your bladder every 4 hours.  Your e-visit answers were reviewed by a board certified advanced clinical practitioner to complete your personal care plan.  Depending on the condition, your plan could have included both over the counter or prescription medications.  If there is a problem please reply  once you have received a response from your provider.  Your safety is important to [place].  If you have drug allergies check your prescription carefully.    You can use MyChart to ask questions about today's visit, request a non-urgent call back, or ask for a work or school excuse for 24 hours related to this e-Visit. If it has been greater than 24 hours you will need to follow up with your provider, or enter a new e-Visit to address those concerns.   You will get an e-mail in the next two days asking about your experience.  I hope that your e-visit has been valuable and will speed your recovery. Thank you for using e-visits.  5-10 minutes spent reviewing and documenting in chart.

## 2019-12-19 ENCOUNTER — Telehealth: Payer: Medicaid Other | Admitting: Physician Assistant

## 2019-12-19 DIAGNOSIS — R21 Rash and other nonspecific skin eruption: Secondary | ICD-10-CM

## 2019-12-19 MED ORDER — SELENIUM SULFIDE 2.5 % EX LOTN: 1.0000 "application " | TOPICAL_LOTION | Freq: Every day | CUTANEOUS | 0 refills | Status: DC | PRN

## 2019-12-19 MED ORDER — FLUCONAZOLE 150 MG PO TABS: 150.0000 mg | ORAL_TABLET | ORAL | 0 refills | Status: AC

## 2019-12-19 NOTE — Progress Notes (Signed)
E Visit for Rash  We are sorry that you are not feeling well. Here is how we plan to help!  Based upon your presentation it appears you have a fungal infection.  I have prescribed: Diflucan 150mg  tablets to be taken once weekly for 3 weeks. I have also prescribed selenium sulfide shampoo to be applied all over the body while showering. Ask the pharmacist about how best to apply this when you pick up the prescription.   If the rash does not clear up with this regimen, please follow up with your PCP or a dermatologist. Seek immediate attention if you develop facial swelling, or difficulty breathing or swallowing.    HOME CARE:   Take cool showers and avoid direct sunlight.  Apply cool compress or wet dressings.  Take a bath in an oatmeal bath.  Sprinkle content of one Aveeno packet under running faucet with comfortably warm water.  Bathe for 15-20 minutes, 1-2 times daily.  Pat dry with a towel. Do not rub the rash.  Use hydrocortisone cream.  Take an antihistamine like Benadryl for widespread rashes that itch.  The adult dose of Benadryl is 25-50 mg by mouth 4 times daily.  Caution:  This type of medication may cause sleepiness.  Do not drink alcohol, drive, or operate dangerous machinery while taking antihistamines.  Do not take these medications if you have prostate enlargement.  Read package instructions thoroughly on all medications that you take.  GET HELP RIGHT AWAY IF:   Symptoms don't go away after treatment.  Severe itching that persists.  If you rash spreads or swells.  If you rash begins to smell.  If it blisters and opens or develops a yellow-brown crust.  You develop a fever.  You have a sore throat.  You become short of breath.  MAKE SURE YOU:  Understand these instructions. Will watch your condition. Will get help right away if you are not doing well or get worse.  Thank you for choosing an e-visit. Your e-visit answers were reviewed by a board  certified advanced clinical practitioner to complete your personal care plan. Depending upon the condition, your plan could have included both over the counter or prescription medications. Please review your pharmacy choice. Be sure that the pharmacy you have chosen is open so that you can pick up your prescription now.  If there is a problem you may message your provider in MyChart to have the prescription routed to another pharmacy. Your safety is important to . If you have drug allergies check your prescription carefully.  For the next 24 hours, you can use MyChart to ask questions about today's visit, request a non-urgent call back, or ask for a work or school excuse from your e-visit provider. You will get an email in the next two days asking about your experience. I hope that your e-visit has been valuable and will speed your recovery.  Greater than 5 minutes, yet less than 10 minutes of time have been spent researching, coordinating, and implementing care for this patient today.

## 2022-01-25 ENCOUNTER — Ambulatory Visit
Admission: EM | Admit: 2022-01-25 | Discharge: 2022-01-25 | Disposition: A | Discharge status: Home / Self Care | Payer: Medicaid Other

## 2022-01-25 ENCOUNTER — Ambulatory Visit: Payer: Medicaid Other

## 2022-01-25 DIAGNOSIS — Z9889 Other specified postprocedural states: Secondary | ICD-10-CM | POA: Diagnosis not present

## 2022-01-25 DIAGNOSIS — I10 Essential (primary) hypertension: Secondary | ICD-10-CM | POA: Diagnosis not present

## 2022-01-25 DIAGNOSIS — M109 Gout, unspecified: Secondary | ICD-10-CM | POA: Diagnosis not present

## 2022-01-25 MED ORDER — PREDNISONE 20 MG PO TABS: ORAL_TABLET | ORAL | 0 refills | Status: DC

## 2022-01-25 MED ORDER — FAMOTIDINE 20 MG PO TABS: 20.0000 mg | ORAL_TABLET | Freq: Two times a day (BID) | ORAL | 0 refills | Status: DC

## 2022-01-25 MED ORDER — OMEPRAZOLE 40 MG PO CPDR: 40.0000 mg | DELAYED_RELEASE_CAPSULE | Freq: Two times a day (BID) | ORAL | 0 refills | Status: AC

## 2022-01-25 MED ORDER — INDOMETHACIN 50 MG PO CAPS: 50.0000 mg | ORAL_CAPSULE | Freq: Two times a day (BID) | ORAL | 5 refills | Status: DC

## 2022-01-25 NOTE — Discharge Instructions (Addendum)
We will be using prednisone for a suspected gout attack. Do not fill the script for indomethacin. To protect your stomach, I am increasing your omeprazole to twice daily for 30 days and adding famotidine twice daily for the same time frame.

## 2022-01-25 NOTE — ED Triage Notes (Signed)
Patient presents to [facility] for right foot pain since last night. Hx of arthritis. Taking tylenol.

## 2022-01-25 NOTE — ED Provider Notes (Signed)
[facility]  Note:  This document was prepared using Systems analyst and may include unintentional dictation errors.  MRN: [id number] DOB: June 17, 1970  Subjective:   Connie Castaneda is a 51 y.o. female presenting for acute onset of severe right foot pain since last night.  No fall, trauma, bruising, known history of issues with this foot.  No history of gout.  No history of kidney disease.  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  Spends a lot of time working full-time on her feet either standing or walking as she works at [employer].  She does take blood pressure medicine including triamterene hydrochlorothiazide at a dose of 37.5-25 mg once daily.  She does admit that she does not practice a healthy diet and eats a lot of meats and processed meats.  She does have a PCP that she follows up with.  Has a history of gastric sleeve.  No current facility-administered medications for this encounter.  Current Outpatient Medications:    omeprazole (PRILOSEC) 40 MG capsule, Take 1 capsule (40 mg total) by mouth daily., Disp: 30 capsule, Rfl: 0   triamterene-hydrochlorothiazide (MAXZIDE-25) 37.5-25 MG tablet, Take 1 tablet by mouth daily., Disp: , Rfl:    atenolol (TENORMIN) 100 MG tablet, Take 100 mg by mouth 2 (two) times daily., Disp: , Rfl:    cephALEXin (KEFLEX) 500 MG capsule, Take 1 capsule (500 mg total) by mouth 2 (two) times daily., Disp: 14 capsule, Rfl: 0   metroNIDAZOLE (FLAGYL) 500 MG tablet, Take 1 tablet (500 mg total) by mouth 2 (two) times daily. One po bid x 7 days, Disp: 14 tablet, Rfl: 0   selenium sulfide (SELSUN) 2.5 % shampoo, Apply 1 application topically daily as needed for irritation., Disp: 118 mL, Rfl: 0   triamcinolone cream (KENALOG) 0.1 %, Apply 1 application topically 2 (two) times daily., Disp: 30 g, Rfl: 0   No Known Allergies  Past Medical History:  Diagnosis Date   GERD (gastroesophageal reflux disease)    Hypertension       Past Surgical History:  Procedure Laterality Date   CESAREAN SECTION     CHOLECYSTECTOMY     FOOT SURGERY      History reviewed. No pertinent family history.  Social History   Tobacco Use   Smoking status: Never   Smokeless tobacco: Never  Substance Use Topics   Alcohol use: No   Drug use: No    ROS   Objective:   Vitals: BP 123/79 (BP Location: Right Arm)   Pulse 80   Temp 98.4 F (36.9 C) (Oral)   Resp 16   LMP 07/03/2017   SpO2 98%   Physical Exam Constitutional:      General: She is not in acute distress.    Appearance: Normal appearance. She is well-developed. She is not ill-appearing, toxic-appearing or diaphoretic.  HENT:     Head: Normocephalic and atraumatic.     Nose: Nose normal.     Mouth/Throat:     Mouth: Mucous membranes are moist.  Eyes:     General: No scleral icterus.       Right eye: No discharge.        Left eye: No discharge.     Extraocular Movements: Extraocular movements intact.  Cardiovascular:     Rate and Rhythm: Normal rate.  Pulmonary:     Effort: Pulmonary effort is normal.  Musculoskeletal:     Right foot: Decreased range of motion. Normal capillary refill.  Swelling, tenderness (over area outlined with associated swelling, warmth, erythema and exquisite tenderness to the lightest palpation) and bony tenderness present. No deformity, bunion, laceration or crepitus.       Feet:  Skin:    General: Skin is warm and dry.  Neurological:     General: No focal deficit present.     Mental Status: She is alert and oriented to person, place, and time.  Psychiatric:        Mood and Affect: Mood normal.        Behavior: Behavior normal.     Assessment and Plan :   PDMP not reviewed this encounter.  1. Acute gout involving toe of right foot, unspecified cause   2. Essential hypertension   3. History of gastric surgery     High suspicion for gout given physical exam findings, general nature of the pain.  Options are  limited given her history of gastric sleeve.  Advised prednisone as of 5-day course, increase omeprazole to twice daily and add famotidine twice daily. Counseled patient on potential for adverse effects with medications prescribed/recommended today, ER and return-to-clinic precautions discussed, patient verbalized understanding.    Wallis Bamberg, PA-C 01/25/22 1335

## 2022-05-25 ENCOUNTER — Ambulatory Visit
Admission: RE | Admit: 2022-05-25 | Discharge: 2022-05-25 | Disposition: A | Discharge status: Home / Self Care | Payer: Medicaid Other | Source: Ambulatory Visit | Attending: Urgent Care | Admitting: Urgent Care

## 2022-05-25 ENCOUNTER — Ambulatory Visit (INDEPENDENT_AMBULATORY_CARE_PROVIDER_SITE_OTHER): Payer: Medicaid Other

## 2022-05-25 VITALS — BP 117/63 | HR 83 | Temp 98.4°F | Resp 20

## 2022-05-25 DIAGNOSIS — M25562 Pain in left knee: Secondary | ICD-10-CM

## 2022-05-25 DIAGNOSIS — R0789 Other chest pain: Secondary | ICD-10-CM | POA: Diagnosis not present

## 2022-05-25 MED ORDER — MELOXICAM 15 MG PO TABS: 7.5000 mg | ORAL_TABLET | Freq: Every day | ORAL | 0 refills | Status: DC

## 2022-05-25 MED ORDER — TIZANIDINE HCL 4 MG PO TABS: 4.0000 mg | ORAL_TABLET | Freq: Every day | ORAL | 0 refills | Status: DC

## 2022-05-25 NOTE — ED Provider Notes (Signed)
[facility]  Note:  This document was prepared using Systems analyst and may include unintentional dictation errors.  MRN: [id number] DOB: [birth year]  Subjective:   Connie Castaneda is a 52 y.o. female presenting for 3 day history of acute onset persistent left knee pain. No fall, trauma, wounds, bleeding. No heavy lifting. No strenuous physical activities. No history of knee issues. Has had several month history of persistent right chest wall pain elicited with random coughing or sneezing. No smoking.   No current facility-administered medications for this encounter.  Current Outpatient Medications:    atenolol (TENORMIN) 100 MG tablet, Take 100 mg by mouth 2 (two) times daily., Disp: , Rfl:    cephALEXin (KEFLEX) 500 MG capsule, Take 1 capsule (500 mg total) by mouth 2 (two) times daily., Disp: 14 capsule, Rfl: 0   famotidine (PEPCID) 20 MG tablet, Take 1 tablet (20 mg total) by mouth 2 (two) times daily., Disp: 60 tablet, Rfl: 0   metroNIDAZOLE (FLAGYL) 500 MG tablet, Take 1 tablet (500 mg total) by mouth 2 (two) times daily. One po bid x 7 days, Disp: 14 tablet, Rfl: 0   omeprazole (PRILOSEC) 40 MG capsule, Take 1 capsule (40 mg total) by mouth daily., Disp: 30 capsule, Rfl: 0   omeprazole (PRILOSEC) 40 MG capsule, Take 1 capsule (40 mg total) by mouth 2 (two) times daily., Disp: 60 capsule, Rfl: 0   predniSONE (DELTASONE) 20 MG tablet, Take 2 tablets daily with breakfast., Disp: 10 tablet, Rfl: 0   selenium sulfide (SELSUN) 2.5 % shampoo, Apply 1 application topically daily as needed for irritation., Disp: 118 mL, Rfl: 0   triamcinolone cream (KENALOG) 0.1 %, Apply 1 application topically 2 (two) times daily., Disp: 30 g, Rfl: 0   triamterene-hydrochlorothiazide (MAXZIDE-25) 37.5-25 MG tablet, Take 1 tablet by mouth daily., Disp: , Rfl:    No Known Allergies  Past Medical History:  Diagnosis Date   GERD (gastroesophageal reflux disease)     Hypertension      Past Surgical History:  Procedure Laterality Date   CESAREAN SECTION     CHOLECYSTECTOMY     FOOT SURGERY     LAPAROSCOPIC GASTRIC SLEEVE RESECTION      No family history on file.  Social History   Tobacco Use   Smoking status: Never   Smokeless tobacco: Never  Substance Use Topics   Alcohol use: No   Drug use: No    ROS   Objective:   Vitals: BP 117/63 (BP Location: Right Arm)   Pulse 83   Temp 98.4 F (36.9 C) (Oral)   Resp 20   LMP 07/03/2017   SpO2 95%   Physical Exam Constitutional:      General: She is not in acute distress.    Appearance: Normal appearance. She is well-developed. She is not ill-appearing, toxic-appearing or diaphoretic.  HENT:     Head: Normocephalic and atraumatic.     Nose: Nose normal.     Mouth/Throat:     Mouth: Mucous membranes are moist.  Eyes:     General: No scleral icterus.       Right eye: No discharge.        Left eye: No discharge.     Extraocular Movements: Extraocular movements intact.  Cardiovascular:     Rate and Rhythm: Normal rate and regular rhythm.     Heart sounds: Normal heart sounds. No murmur heard.    No friction rub. No gallop.  Pulmonary:     Effort: Pulmonary effort is normal. No respiratory distress.     Breath sounds: No stridor. No wheezing, rhonchi or rales.  Chest:     Chest wall: No tenderness.  Musculoskeletal:     Left knee: Swelling (posteriorly) present. No deformity, effusion, erythema, ecchymosis, lacerations, bony tenderness or crepitus. Decreased range of motion. Tenderness (posteriorly, superficially; no erythema, warmth,abscess) present. No medial joint line, lateral joint line or patellar tendon tenderness. Normal alignment and normal patellar mobility.  Skin:    General: Skin is warm and dry.  Neurological:     General: No focal deficit present.     Mental Status: She is alert and oriented to person, place, and time.  Psychiatric:        Mood and Affect: Mood  normal.        Behavior: Behavior normal.    DG Ribs Unilateral W/Chest Right  Result Date: 05/25/2022 CLINICAL DATA:  Right chest wall pain EXAM: RIGHT RIBS AND CHEST - 3+ VIEW COMPARISON:  07/18/2013 FINDINGS: Heart and mediastinal shadows are normal. Lungs are clear. Normal appearance of the right ribs. IMPRESSION: No active disease. Normal appearance of the right ribs. Electronically Signed   By: Nelson Chimes M.D.   On: 05/25/2022 15:32    Assessment and Plan :   PDMP not reviewed this encounter.  1. Chest wall pain   2. Posterior knee pain, left     Recommended managing for musculoskeletal chest wall pain with meloxicam and tizanidine.  Follow-up with PCP to coordinate her mammogram.  Also recommended following up with an orthopedist as I suspect she has a Baker's cyst.  She has no bony tenderness.  Low suspicion for DVT.  No history of heart disease, kidney disease and therefore okay to take meloxicam. Counseled patient on potential for adverse effects with medications prescribed/recommended today, ER and return-to-clinic precautions discussed, patient verbalized understanding.    Jaynee Eagles, [place] 05/25/22 [phone]

## 2022-05-25 NOTE — Discharge Instructions (Signed)
Please follow-up with an orthopedist for further evaluation of a suspected Baker cyst of the back of your left knee.  In the meantime, use meloxicam for pain and inflammation.  Follow-up with your PCP to set up your mammogram.

## 2022-05-25 NOTE — ED Triage Notes (Addendum)
Pt c/o pain to left posterior knee x 3 days-denies injury-also c/o pain under right breast x "months" with dx in the past of costochondritis- worse x 3 days-NAD-limping gait

## 2022-05-27 ENCOUNTER — Other Ambulatory Visit (HOSPITAL_COMMUNITY): Payer: Self-pay | Admitting: Physician Assistant

## 2022-05-27 ENCOUNTER — Ambulatory Visit (HOSPITAL_COMMUNITY)
Admission: RE | Admit: 2022-05-27 | Discharge: 2022-05-27 | Disposition: A | Discharge status: Home / Self Care | Payer: Medicaid Other | Source: Ambulatory Visit | Attending: Cardiovascular Disease | Admitting: Cardiovascular Disease

## 2022-05-27 DIAGNOSIS — M79605 Pain in left leg: Secondary | ICD-10-CM | POA: Insufficient documentation

## 2022-05-27 DIAGNOSIS — M7989 Other specified soft tissue disorders: Secondary | ICD-10-CM | POA: Diagnosis not present

## 2022-05-28 ENCOUNTER — Encounter (HOSPITAL_BASED_OUTPATIENT_CLINIC_OR_DEPARTMENT_OTHER): Payer: Self-pay | Admitting: Emergency Medicine

## 2022-05-28 ENCOUNTER — Emergency Department (HOSPITAL_BASED_OUTPATIENT_CLINIC_OR_DEPARTMENT_OTHER)
Admission: EM | Admit: 2022-05-28 | Discharge: 2022-05-28 | Disposition: A | Discharge status: Home / Self Care | Payer: Medicaid Other | Attending: Emergency Medicine | Admitting: Emergency Medicine

## 2022-05-28 ENCOUNTER — Other Ambulatory Visit: Payer: Self-pay

## 2022-05-28 ENCOUNTER — Emergency Department (HOSPITAL_BASED_OUTPATIENT_CLINIC_OR_DEPARTMENT_OTHER): Payer: Medicaid Other

## 2022-05-28 DIAGNOSIS — M79605 Pain in left leg: Secondary | ICD-10-CM | POA: Insufficient documentation

## 2022-05-28 DIAGNOSIS — I10 Essential (primary) hypertension: Secondary | ICD-10-CM | POA: Diagnosis not present

## 2022-05-28 MED ORDER — ONDANSETRON 4 MG PO TBDP: 4.0000 mg | ORAL_TABLET | Freq: Three times a day (TID) | ORAL | 0 refills | Status: DC | PRN

## 2022-05-28 MED ORDER — ONDANSETRON 4 MG PO TBDP
4.0000 mg | ORAL_TABLET | Freq: Once | ORAL | Status: AC
  Administered 2022-05-28: 4 mg via ORAL
  Filled 2022-05-28: qty 1

## 2022-05-28 MED ORDER — HYDROCODONE-ACETAMINOPHEN 5-325 MG PO TABS
1.0000 | ORAL_TABLET | Freq: Once | ORAL | Status: AC
  Administered 2022-05-28: 1 via ORAL
  Filled 2022-05-28: qty 1

## 2022-05-28 MED ORDER — SENNOSIDES-DOCUSATE SODIUM 8.6-50 MG PO TABS: 1.0000 | ORAL_TABLET | Freq: Every evening | ORAL | 0 refills | Status: DC | PRN

## 2022-05-28 MED ORDER — HYDROCODONE-ACETAMINOPHEN 5-325 MG PO TABS: 1.0000 | ORAL_TABLET | Freq: Four times a day (QID) | ORAL | 0 refills | Status: DC | PRN

## 2022-05-28 NOTE — ED Provider Notes (Signed)
Emergency Department Provider Note   I have reviewed the triage vital signs and the nursing notes.   HISTORY  Chief Complaint Leg Pain   HPI Kalena Holzer is a 52 y.o. female with past history of GERD, hypertension presents emergency department with left leg pain.  She arrived by ambulance.  She notes pain has been in the left calf and behind the left knee for the past several days.  She has been to urgent care as well as orthopedics.  She reports x-rays performed at Ortho but unsure of the results.  She was sent for DVT ultrasound yesterday which was negative for DVT or Baker's cyst.  She been trying her meloxicam as well as tizanidine with little relief in pain and so presents here for evaluation.  She does not have a known follow-up plan as of yet with ortho. Denies injury. No fever. No CP or SOB. Some CP noted at UC several days prior with cough/sneeze but that has not returned.    Past Medical History:  Diagnosis Date   GERD (gastroesophageal reflux disease)    Hypertension     Review of Systems  Constitutional: No fever/chills  Cardiovascular: Denies chest pain. Respiratory: Denies shortness of breath. Gastrointestinal: No abdominal pain.  Genitourinary: Negative for dysuria. Musculoskeletal: Negative for back pain. Positive left leg pain.  Skin: Negative for rash. Neurological: Negative for numbness/weakness.    ____________________________________________   PHYSICAL EXAM:  VITAL SIGNS: ED Triage Vitals  Enc Vitals Group     BP 05/28/22 0210 126/70     Pulse Rate 05/28/22 0210 (!) 110     Resp --      Temp 05/28/22 0213 98.5 F (36.9 C)     Temp Source 05/28/22 0213 Oral     SpO2 05/28/22 0210 97 %   Constitutional: Alert and oriented. Well appearing and in no acute distress. Eyes: Conjunctivae are normal.  Head: Atraumatic. Nose: No congestion/rhinnorhea. Mouth/Throat: Mucous membranes are moist.  Neck: No stridor.   Cardiovascular: Normal rate,  regular rhythm. Good peripheral circulation. Grossly normal heart sounds.   Respiratory: Normal respiratory effort.  No retractions. Lungs CTAB. Gastrointestinal: Soft and nontender. No distention.  Musculoskeletal: Mild tenderness to the left posterior calf and popliteal fossa without erythema or induration.  No warmth to touch.  Compartments are soft. No ecchymosis. Normal ROM of the left knee, ankle, and hip. Neurologic:  Normal speech and language. No gross focal neurologic deficits are appreciated.  Skin:  Skin is warm, dry and intact. No rash noted.  ____________________________________________  RADIOLOGY  DG Knee Complete 4 Views Left  Result Date: 05/28/2022 CLINICAL DATA:  Swelling and leg pain EXAM: LEFT KNEE - COMPLETE 4+ VIEW COMPARISON:  None Available. FINDINGS: No evidence of fracture or dislocation. Small joint effusion. No evidence of arthropathy or other focal bone abnormality. Soft tissues are unremarkable. IMPRESSION: No acute fracture or dislocation. Electronically Signed   By: Placido Sou M.D.   On: 05/28/2022 02:57   VAS [place] LOWER EXTREMITY VENOUS (DVT)  Result Date: 05/27/2022  Lower Venous DVT Study Patient Name:  ANALISIA ODEAN  Date of Exam:   05/27/2022 Medical Rec #: [id number]    Accession #:    [id number] Date of Birth: 01/25/1971    Patient Gender: F Patient Age:   48 years Exam Location:  [facility] Procedure:      VAS [place] LOWER EXTREMITY VENOUS (DVT) Referring Phys: Salim Forero CHADWELL --------------------------------------------------------------------------------  Indications: Patient reports worsening pain and swelling in the  left posterior knee and calf since Monday, 05/23/2022. She denies any SOB.  Risk Factors: None identified. Comparison Study: NA Performing Technologist: Sharlett Iles RVT  Examination Guidelines: A complete evaluation includes B-mode imaging, spectral Doppler, color Doppler, and power Doppler as needed of all accessible portions of each vessel.  Bilateral testing is considered an integral part of a complete examination. Limited examinations for reoccurring indications may be performed as noted. The reflux portion of the exam is performed with the patient in reverse Trendelenburg.  +---------+---------------+---------+-----------+----------+--------------+ LEFT     CompressibilityPhasicitySpontaneityPropertiesThrombus Aging +---------+---------------+---------+-----------+----------+--------------+ CFV      Full           Yes      Yes                                 +---------+---------------+---------+-----------+----------+--------------+ SFJ      Full           Yes      Yes                                 +---------+---------------+---------+-----------+----------+--------------+ FV Prox  Full           Yes      Yes                                 +---------+---------------+---------+-----------+----------+--------------+ FV Mid   Full                                                        +---------+---------------+---------+-----------+----------+--------------+ FV DistalFull           Yes      Yes                                 +---------+---------------+---------+-----------+----------+--------------+ PFV      Full                                                        +---------+---------------+---------+-----------+----------+--------------+ POP      Full           Yes      Yes                                 +---------+---------------+---------+-----------+----------+--------------+ PTV      Full                                                        +---------+---------------+---------+-----------+----------+--------------+ PERO     Full                                                        +---------+---------------+---------+-----------+----------+--------------+  Gastroc  Full                                                         +---------+---------------+---------+-----------+----------+--------------+ GSV      Full           Yes      Yes                                 +---------+---------------+---------+-----------+----------+--------------+   Left Technical Findings: Rouleau flow noted in the CFV, SFJ and proximal great saphenous vein. Edema noted throughout the medial calf. Limited visualization of the PTVs and peroneal veins due to body habitus, and increase lower leg swelling and edema. Specifically, the popliteal  vein and tibioperoneal confluence are without thrombus.   Summary: RIGHT: - No evidence of common femoral vein obstruction.  LEFT: - No evidence of deep vein thrombosis in the lower extremity. No indirect evidence of obstruction proximal to the inguinal ligament. - No cystic structure found in the popliteal fossa.  *See table(s) above for measurements and observations. Electronically signed by Orlie Pollen on 05/27/2022 at 6:44:14 PM.    Final     ____________________________________________   PROCEDURES  Procedure(s) performed:   Procedures  None  ____________________________________________   INITIAL IMPRESSION / ASSESSMENT AND PLAN / ED COURSE  Pertinent labs & imaging results that were available during my care of the patient were reviewed by me and considered in my medical decision making (see chart for details).   This patient is Presenting for Evaluation of leg pain, which does require a range of treatment options, and is a complaint that involves a moderate risk of morbidity and mortality.  The Differential Diagnoses include DVT, MSK strain, fracture, dislocation, septic joint, compartment syndrome, Baker's cyst, etc.  Critical Interventions-    Medications  HYDROcodone-acetaminophen (NORCO/VICODIN) 5-325 MG per tablet 1 tablet (1 tablet Oral Given 05/28/22 0229)  ondansetron (ZOFRAN-ODT) disintegrating tablet 4 mg (4 mg Oral Given 05/28/22 0229)    Reassessment after  intervention: pain improved.   Radiologic Tests Ordered, included knee x-ray. I independently interpreted the images and agree with radiology interpretation.   Medical Decision Making: Summary:  Patient presents emergency department with knee/leg pain.  No injury.  DVT study yesterday negative for DVT or Baker's cyst.  Compartments are soft.  I cannot evaluate the plain films reportedly done at the orthopedist and so plan to repeat those here.  No outward signs to suspect infection.  Patient may ultimately require close follow-up with orthopedics.   Reevaluation with update and discussion with patient.  Discussed imaging results.  Small amount of pain medication called to the pharmacy and crutches provided.  Patient to call orthopedics on Monday for follow-up evaluation.  Patient's presentation is most consistent with acute, uncomplicated illness.   Disposition: discharge  ____________________________________________  FINAL CLINICAL IMPRESSION(S) / ED DIAGNOSES  Final diagnoses:  Left leg pain     NEW OUTPATIENT MEDICATIONS STARTED DURING THIS VISIT:  New Prescriptions   HYDROCODONE-ACETAMINOPHEN (NORCO/VICODIN) 5-325 MG TABLET    Take 1 tablet by mouth every 6 (six) hours as needed for severe pain.   ONDANSETRON (ZOFRAN-ODT) 4 MG DISINTEGRATING TABLET    Take 1 tablet (4 mg total) by mouth every 8 (eight) hours as  needed.   SENNA-DOCUSATE (SENOKOT-S) 8.6-50 MG TABLET    Take 1 tablet by mouth at bedtime as needed for mild constipation.    Note:  This document was prepared using Dragon voice recognition software and may include unintentional dictation errors.  Nanda Quinton, MD, [facility]    Salle Brandle, [facility], MD 05/28/22 [phone]

## 2022-05-28 NOTE — Discharge Instructions (Signed)
[facility] should help with your pain symptoms.  Please follow closely with your orthopedist in the coming week.  Return with any new or suddenly worsening symptoms.

## 2022-05-28 NOTE — ED Triage Notes (Addendum)
Pt BIB EMS from home c/o left leg pain since Monday. Noticed swelling two days ago. Had negative dvt study today. Unable to find pain relief with home meds.  Also endorses nausea, no vomiting, chills and body aches. Tylenol helps relieve chills, but not body aches. Last dose of tylenol around 7 pm yesterday. Known recent sick exposure to family member with a "cold"

## 2022-06-04 ENCOUNTER — Emergency Department (HOSPITAL_COMMUNITY): Payer: Medicaid Other

## 2022-06-04 ENCOUNTER — Encounter (HOSPITAL_COMMUNITY)
Admission: EM | Disposition: A | Discharge status: Home / Self Care | Payer: Self-pay | Source: Home / Self Care | Attending: Family Medicine

## 2022-06-04 ENCOUNTER — Emergency Department (HOSPITAL_COMMUNITY): Payer: Medicaid Other | Admitting: Anesthesiology

## 2022-06-04 ENCOUNTER — Emergency Department (HOSPITAL_BASED_OUTPATIENT_CLINIC_OR_DEPARTMENT_OTHER): Payer: Medicaid Other

## 2022-06-04 ENCOUNTER — Encounter (HOSPITAL_COMMUNITY): Payer: Self-pay | Admitting: Anesthesiology

## 2022-06-04 ENCOUNTER — Other Ambulatory Visit: Payer: Self-pay

## 2022-06-04 ENCOUNTER — Inpatient Hospital Stay (HOSPITAL_COMMUNITY)
Admission: EM | Admit: 2022-06-04 | Discharge: 2022-06-07 | DRG: 854 | Disposition: A | Discharge status: Home / Self Care | Payer: Medicaid Other | Attending: Family Medicine | Admitting: Family Medicine

## 2022-06-04 DIAGNOSIS — R110 Nausea: Secondary | ICD-10-CM | POA: Diagnosis not present

## 2022-06-04 DIAGNOSIS — R7303 Prediabetes: Secondary | ICD-10-CM | POA: Diagnosis present

## 2022-06-04 DIAGNOSIS — T3995XA Adverse effect of unspecified nonopioid analgesic, antipyretic and antirheumatic, initial encounter: Secondary | ICD-10-CM | POA: Diagnosis not present

## 2022-06-04 DIAGNOSIS — Z833 Family history of diabetes mellitus: Secondary | ICD-10-CM | POA: Diagnosis not present

## 2022-06-04 DIAGNOSIS — M25562 Pain in left knee: Secondary | ICD-10-CM | POA: Diagnosis present

## 2022-06-04 DIAGNOSIS — R52 Pain, unspecified: Secondary | ICD-10-CM | POA: Diagnosis not present

## 2022-06-04 DIAGNOSIS — E871 Hypo-osmolality and hyponatremia: Secondary | ICD-10-CM | POA: Diagnosis present

## 2022-06-04 DIAGNOSIS — M60062 Infective myositis, left lower leg: Secondary | ICD-10-CM | POA: Diagnosis present

## 2022-06-04 DIAGNOSIS — M7122 Synovial cyst of popliteal space [Baker], left knee: Secondary | ICD-10-CM | POA: Diagnosis present

## 2022-06-04 DIAGNOSIS — L02416 Cutaneous abscess of left lower limb: Secondary | ICD-10-CM

## 2022-06-04 DIAGNOSIS — M109 Gout, unspecified: Secondary | ICD-10-CM | POA: Diagnosis present

## 2022-06-04 DIAGNOSIS — Z9884 Bariatric surgery status: Secondary | ICD-10-CM

## 2022-06-04 DIAGNOSIS — A4102 Sepsis due to Methicillin resistant Staphylococcus aureus: Secondary | ICD-10-CM | POA: Diagnosis present

## 2022-06-04 DIAGNOSIS — K219 Gastro-esophageal reflux disease without esophagitis: Secondary | ICD-10-CM | POA: Diagnosis present

## 2022-06-04 DIAGNOSIS — Z791 Long term (current) use of non-steroidal anti-inflammatories (NSAID): Secondary | ICD-10-CM

## 2022-06-04 DIAGNOSIS — M25462 Effusion, left knee: Secondary | ICD-10-CM

## 2022-06-04 DIAGNOSIS — R300 Dysuria: Secondary | ICD-10-CM | POA: Diagnosis present

## 2022-06-04 DIAGNOSIS — I10 Essential (primary) hypertension: Secondary | ICD-10-CM | POA: Diagnosis present

## 2022-06-04 DIAGNOSIS — I959 Hypotension, unspecified: Secondary | ICD-10-CM | POA: Diagnosis present

## 2022-06-04 DIAGNOSIS — R739 Hyperglycemia, unspecified: Secondary | ICD-10-CM | POA: Diagnosis present

## 2022-06-04 DIAGNOSIS — E8809 Other disorders of plasma-protein metabolism, not elsewhere classified: Secondary | ICD-10-CM | POA: Diagnosis present

## 2022-06-04 DIAGNOSIS — M7989 Other specified soft tissue disorders: Secondary | ICD-10-CM | POA: Diagnosis not present

## 2022-06-04 DIAGNOSIS — Z79899 Other long term (current) drug therapy: Secondary | ICD-10-CM | POA: Diagnosis not present

## 2022-06-04 DIAGNOSIS — E669 Obesity, unspecified: Secondary | ICD-10-CM | POA: Diagnosis present

## 2022-06-04 DIAGNOSIS — R7881 Bacteremia: Secondary | ICD-10-CM | POA: Diagnosis not present

## 2022-06-04 DIAGNOSIS — L0291 Cutaneous abscess, unspecified: Principal | ICD-10-CM

## 2022-06-04 DIAGNOSIS — Z6839 Body mass index (BMI) 39.0-39.9, adult: Secondary | ICD-10-CM

## 2022-06-04 HISTORY — PX: I & D EXTREMITY: SHX5045

## 2022-06-04 LAB — COMPREHENSIVE METABOLIC PANEL
ALT: 13 U/L (ref 0–44)
AST: 22 U/L (ref 15–41)
Albumin: 2.8 g/dL — ABNORMAL LOW (ref 3.5–5.0)
Alkaline Phosphatase: 92 U/L (ref 38–126)
Anion gap: 11 (ref 5–15)
BUN: 10 mg/dL (ref 6–20)
CO2: 28 mmol/L (ref 22–32)
Calcium: 8.2 mg/dL — ABNORMAL LOW (ref 8.9–10.3)
Chloride: 94 mmol/L — ABNORMAL LOW (ref 98–111)
Creatinine, Ser: 0.78 mg/dL (ref 0.44–1.00)
GFR, Estimated: >60 mL/min (ref >60)
Glucose, Bld: 116 mg/dL — ABNORMAL HIGH (ref 70–99)
Potassium: 3.9 mmol/L (ref 3.5–5.1)
Sodium: 133 mmol/L — ABNORMAL LOW (ref 135–145)
Total Bilirubin: 0.7 mg/dL (ref 0.3–1.2)
Total Protein: 8.6 g/dL — ABNORMAL HIGH (ref 6.5–8.1)

## 2022-06-04 LAB — CBC
HCT: 38.5 % (ref 36.0–46.0)
Hemoglobin: 12.1 g/dL (ref 12.0–15.0)
MCH: 28.1 pg (ref 26.0–34.0)
MCHC: 31.4 g/dL (ref 30.0–36.0)
MCV: 89.3 fL (ref 80.0–100.0)
Platelets: 371 10*3/uL (ref 150–400)
RBC: 4.31 MIL/uL (ref 3.87–5.11)
RDW: 13.2 % (ref 11.5–15.5)
WBC: 17.2 10*3/uL — ABNORMAL HIGH (ref 4.0–10.5)
nRBC: 0 % (ref 0.0–0.2)

## 2022-06-04 LAB — CBC WITH DIFFERENTIAL/PLATELET
Abs Immature Granulocytes: 0.19 10*3/uL — ABNORMAL HIGH (ref 0.00–0.07)
Basophils Absolute: 0 10*3/uL (ref 0.0–0.1)
Basophils Relative: 0 %
Eosinophils Absolute: 0.1 10*3/uL (ref 0.0–0.5)
Eosinophils Relative: 0 %
HCT: 40.8 % (ref 36.0–46.0)
Hemoglobin: 12.4 g/dL (ref 12.0–15.0)
Immature Granulocytes: 1 %
Lymphocytes Relative: 12 %
Lymphs Abs: 1.8 10*3/uL (ref 0.7–4.0)
MCH: 27.5 pg (ref 26.0–34.0)
MCHC: 30.4 g/dL (ref 30.0–36.0)
MCV: 90.5 fL (ref 80.0–100.0)
Monocytes Absolute: 0.9 10*3/uL (ref 0.1–1.0)
Monocytes Relative: 6 %
Neutro Abs: 12.6 10*3/uL — ABNORMAL HIGH (ref 1.7–7.7)
Neutrophils Relative %: 81 %
Platelets: 341 10*3/uL (ref 150–400)
RBC: 4.51 MIL/uL (ref 3.87–5.11)
RDW: 13.3 % (ref 11.5–15.5)
WBC: 15.5 10*3/uL — ABNORMAL HIGH (ref 4.0–10.5)
nRBC: 0 % (ref 0.0–0.2)

## 2022-06-04 LAB — CREATININE, SERUM
Creatinine, Ser: 0.57 mg/dL (ref 0.44–1.00)
GFR, Estimated: >60 mL/min (ref >60)

## 2022-06-04 LAB — LACTIC ACID, PLASMA
Lactic Acid, Venous: 1.7 mmol/L (ref 0.5–1.9)
Lactic Acid, Venous: 1.2 mmol/L (ref 0.5–1.9)

## 2022-06-04 LAB — CK: Total CK: 103 U/L (ref 38–234)

## 2022-06-04 SURGERY — IRRIGATION AND DEBRIDEMENT EXTREMITY
Anesthesia: General | Laterality: Left

## 2022-06-04 MED ORDER — FENTANYL CITRATE PF 50 MCG/ML IJ SOSY: 12.5000 ug | PREFILLED_SYRINGE | INTRAMUSCULAR | Status: DC | PRN

## 2022-06-04 MED ORDER — METOCLOPRAMIDE HCL 5 MG PO TABS: 5.0000 mg | ORAL_TABLET | Freq: Three times a day (TID) | ORAL | Status: DC | PRN

## 2022-06-04 MED ORDER — HYDROMORPHONE HCL 1 MG/ML IJ SOLN: 0.5000 mg | Freq: Once | INTRAMUSCULAR | Status: DC

## 2022-06-04 MED ORDER — ONDANSETRON HCL 4 MG/2ML IJ SOLN
4.0000 mg | Freq: Four times a day (QID) | INTRAMUSCULAR | Status: DC | PRN
  Administered 2022-06-05 (×2): 4 mg via INTRAVENOUS
  Filled 2022-06-04 (×2): qty 2

## 2022-06-04 MED ORDER — HYDROCODONE-ACETAMINOPHEN 5-325 MG PO TABS: 1.0000 | ORAL_TABLET | ORAL | Status: DC | PRN

## 2022-06-04 MED ORDER — SODIUM CHLORIDE 0.9 % IV SOLN
2.0000 g | Freq: Three times a day (TID) | INTRAVENOUS | Status: DC
  Administered 2022-06-05 – 2022-06-06 (×5): 2 g via INTRAVENOUS
  Filled 2022-06-04 (×5): qty 12.5

## 2022-06-04 MED ORDER — VANCOMYCIN HCL IN DEXTROSE 1-5 GM/200ML-% IV SOLN
1000.0000 mg | Freq: Two times a day (BID) | INTRAVENOUS | Status: DC
  Administered 2022-06-05 – 2022-06-07 (×5): 1000 mg via INTRAVENOUS
  Filled 2022-06-04 (×5): qty 200

## 2022-06-04 MED ORDER — TRIAMTERENE-HCTZ 37.5-25 MG PO TABS
1.0000 | ORAL_TABLET | Freq: Every day | ORAL | Status: DC
  Administered 2022-06-05: 1 via ORAL
  Filled 2022-06-04: qty 1

## 2022-06-04 MED ORDER — MIDAZOLAM HCL 2 MG/2ML IJ SOLN
INTRAMUSCULAR | Status: AC
  Filled 2022-06-04: qty 2

## 2022-06-04 MED ORDER — FAMOTIDINE 20 MG PO TABS: 20.0000 mg | ORAL_TABLET | Freq: Two times a day (BID) | ORAL | Status: DC

## 2022-06-04 MED ORDER — POVIDONE-IODINE 10 % EX SWAB: 2.0000 | Freq: Once | CUTANEOUS | Status: DC

## 2022-06-04 MED ORDER — MIDAZOLAM HCL 5 MG/5ML IJ SOLN
INTRAMUSCULAR | Status: DC | PRN
  Administered 2022-06-04: 2 mg via INTRAVENOUS

## 2022-06-04 MED ORDER — ONDANSETRON HCL 4 MG/2ML IJ SOLN
INTRAMUSCULAR | Status: DC | PRN
  Administered 2022-06-04: 4 mg via INTRAVENOUS

## 2022-06-04 MED ORDER — 0.9 % SODIUM CHLORIDE (POUR BTL) OPTIME
TOPICAL | Status: DC | PRN
  Administered 2022-06-04: 1000 mL

## 2022-06-04 MED ORDER — POLYETHYLENE GLYCOL 3350 17 G PO PACK: 17.0000 g | PACK | Freq: Every day | ORAL | Status: DC | PRN

## 2022-06-04 MED ORDER — DOCUSATE SODIUM 100 MG PO CAPS
100.0000 mg | ORAL_CAPSULE | Freq: Two times a day (BID) | ORAL | Status: DC
  Administered 2022-06-05 – 2022-06-07 (×3): 100 mg via ORAL
  Filled 2022-06-04 (×4): qty 1

## 2022-06-04 MED ORDER — DEXAMETHASONE SODIUM PHOSPHATE 10 MG/ML IJ SOLN
INTRAMUSCULAR | Status: DC | PRN
  Administered 2022-06-04: 10 mg via INTRAVENOUS

## 2022-06-04 MED ORDER — CEFAZOLIN SODIUM-DEXTROSE 2-3 GM-%(50ML) IV SOLR
INTRAVENOUS | Status: DC | PRN
  Administered 2022-06-04: 2 g via INTRAVENOUS

## 2022-06-04 MED ORDER — PREDNISONE 20 MG PO TABS: 20.0000 mg | ORAL_TABLET | Freq: Every day | ORAL | Status: DC

## 2022-06-04 MED ORDER — HYDROMORPHONE HCL 1 MG/ML IJ SOLN
0.5000 mg | Freq: Once | INTRAMUSCULAR | Status: AC
  Administered 2022-06-04: 0.5 mg via INTRAVENOUS
  Filled 2022-06-04: qty 1

## 2022-06-04 MED ORDER — VANCOMYCIN HCL 1000 MG IV SOLR
INTRAVENOUS | Status: DC | PRN
  Administered 2022-06-04: 1000 mg via INTRAVENOUS

## 2022-06-04 MED ORDER — LACTATED RINGERS IV SOLN: INTRAVENOUS | Status: DC

## 2022-06-04 MED ORDER — SODIUM CHLORIDE 0.9 % IV BOLUS: 1000.0000 mL | Freq: Once | INTRAVENOUS | Status: DC

## 2022-06-04 MED ORDER — FENTANYL CITRATE PF 50 MCG/ML IJ SOSY: 25.0000 ug | PREFILLED_SYRINGE | INTRAMUSCULAR | Status: DC | PRN

## 2022-06-04 MED ORDER — SODIUM CHLORIDE 0.9 % IV SOLN: INTRAVENOUS | Status: DC

## 2022-06-04 MED ORDER — SODIUM CHLORIDE (PF) 0.9 % IJ SOLN
INTRAMUSCULAR | Status: AC
  Filled 2022-06-04: qty 20

## 2022-06-04 MED ORDER — SUGAMMADEX SODIUM 500 MG/5ML IV SOLN
INTRAVENOUS | Status: DC | PRN
  Administered 2022-06-04: 400 mg via INTRAVENOUS

## 2022-06-04 MED ORDER — ACETAMINOPHEN 325 MG PO TABS: 325.0000 mg | ORAL_TABLET | Freq: Four times a day (QID) | ORAL | Status: DC | PRN

## 2022-06-04 MED ORDER — ONDANSETRON HCL 4 MG/2ML IJ SOLN
4.0000 mg | Freq: Once | INTRAMUSCULAR | Status: AC
  Administered 2022-06-04: 4 mg via INTRAVENOUS
  Filled 2022-06-04: qty 2

## 2022-06-04 MED ORDER — METOCLOPRAMIDE HCL 5 MG/ML IJ SOLN: 5.0000 mg | Freq: Three times a day (TID) | INTRAMUSCULAR | Status: DC | PRN

## 2022-06-04 MED ORDER — FENTANYL CITRATE (PF) 100 MCG/2ML IJ SOLN
INTRAMUSCULAR | Status: AC
  Filled 2022-06-04: qty 2

## 2022-06-04 MED ORDER — TIZANIDINE HCL 4 MG PO TABS
4.0000 mg | ORAL_TABLET | Freq: Two times a day (BID) | ORAL | Status: DC | PRN
  Administered 2022-06-05: 4 mg via ORAL
  Filled 2022-06-04: qty 1

## 2022-06-04 MED ORDER — PROPOFOL 10 MG/ML IV BOLUS
INTRAVENOUS | Status: DC | PRN
  Administered 2022-06-04: 170 mg via INTRAVENOUS

## 2022-06-04 MED ORDER — CEFAZOLIN SODIUM-DEXTROSE 2-4 GM/100ML-% IV SOLN
INTRAVENOUS | Status: AC
  Filled 2022-06-04: qty 100

## 2022-06-04 MED ORDER — IOHEXOL 300 MG/ML  SOLN
100.0000 mL | Freq: Once | INTRAMUSCULAR | Status: AC | PRN
  Administered 2022-06-04: 100 mL via INTRAVENOUS

## 2022-06-04 MED ORDER — ENOXAPARIN SODIUM 40 MG/0.4ML IJ SOSY
40.0000 mg | PREFILLED_SYRINGE | INTRAMUSCULAR | Status: DC
  Administered 2022-06-05 – 2022-06-07 (×3): 40 mg via SUBCUTANEOUS
  Filled 2022-06-04 (×3): qty 0.4

## 2022-06-04 MED ORDER — SUCCINYLCHOLINE CHLORIDE 200 MG/10ML IV SOSY
PREFILLED_SYRINGE | INTRAVENOUS | Status: DC | PRN
  Administered 2022-06-04: 120 mg via INTRAVENOUS

## 2022-06-04 MED ORDER — VASOPRESSIN 20 UNIT/ML IV SOLN
INTRAVENOUS | Status: AC
  Filled 2022-06-04: qty 1

## 2022-06-04 MED ORDER — FENTANYL CITRATE (PF) 100 MCG/2ML IJ SOLN
INTRAMUSCULAR | Status: DC | PRN
  Administered 2022-06-04 (×2): 100 ug via INTRAVENOUS

## 2022-06-04 MED ORDER — PROPOFOL 10 MG/ML IV BOLUS
INTRAVENOUS | Status: AC
  Filled 2022-06-04: qty 20

## 2022-06-04 MED ORDER — SODIUM CHLORIDE 0.9 % IV SOLN: 2.0000 g | Freq: Three times a day (TID) | INTRAVENOUS | Status: DC

## 2022-06-04 MED ORDER — CHLORHEXIDINE GLUCONATE 4 % EX LIQD: 60.0000 mL | Freq: Once | CUTANEOUS | Status: DC

## 2022-06-04 MED ORDER — METOPROLOL TARTRATE 50 MG PO TABS
50.0000 mg | ORAL_TABLET | Freq: Every day | ORAL | Status: DC
  Administered 2022-06-05 – 2022-06-07 (×3): 50 mg via ORAL
  Filled 2022-06-04 (×3): qty 1

## 2022-06-04 MED ORDER — FLEET ENEMA 7-19 GM/118ML RE ENEM: 1.0000 | ENEMA | Freq: Once | RECTAL | Status: DC | PRN

## 2022-06-04 MED ORDER — SENNA 8.6 MG PO TABS
1.0000 | ORAL_TABLET | Freq: Two times a day (BID) | ORAL | Status: DC
  Administered 2022-06-05 – 2022-06-07 (×3): 8.6 mg via ORAL
  Filled 2022-06-04 (×4): qty 1

## 2022-06-04 MED ORDER — ACETAMINOPHEN 10 MG/ML IV SOLN: 1000.0000 mg | Freq: Once | INTRAVENOUS | Status: DC | PRN

## 2022-06-04 MED ORDER — ONDANSETRON HCL 4 MG/2ML IJ SOLN: 4.0000 mg | Freq: Four times a day (QID) | INTRAMUSCULAR | Status: DC | PRN

## 2022-06-04 MED ORDER — ONDANSETRON HCL 4 MG PO TABS: 4.0000 mg | ORAL_TABLET | Freq: Four times a day (QID) | ORAL | Status: DC | PRN

## 2022-06-04 MED ORDER — ACETAMINOPHEN 650 MG RE SUPP: 650.0000 mg | Freq: Four times a day (QID) | RECTAL | Status: DC | PRN

## 2022-06-04 MED ORDER — SUGAMMADEX SODIUM 500 MG/5ML IV SOLN
INTRAVENOUS | Status: AC
  Filled 2022-06-04: qty 5

## 2022-06-04 MED ORDER — MELOXICAM 15 MG PO TABS
7.5000 mg | ORAL_TABLET | Freq: Every day | ORAL | Status: DC
  Filled 2022-06-04: qty 1

## 2022-06-04 MED ORDER — BUPIVACAINE-EPINEPHRINE (PF) 0.25% -1:200000 IJ SOLN
INTRAMUSCULAR | Status: AC
  Filled 2022-06-04: qty 30

## 2022-06-04 MED ORDER — AMISULPRIDE (ANTIEMETIC) 5 MG/2ML IV SOLN: 10.0000 mg | Freq: Once | INTRAVENOUS | Status: DC | PRN

## 2022-06-04 MED ORDER — VANCOMYCIN HCL IN DEXTROSE 1-5 GM/200ML-% IV SOLN
INTRAVENOUS | Status: AC
  Filled 2022-06-04: qty 200

## 2022-06-04 MED ORDER — ACETAMINOPHEN 325 MG PO TABS: 650.0000 mg | ORAL_TABLET | Freq: Four times a day (QID) | ORAL | Status: DC | PRN

## 2022-06-04 MED ORDER — HEPARIN SODIUM (PORCINE) 5000 UNIT/ML IJ SOLN: 5000.0000 [IU] | Freq: Three times a day (TID) | INTRAMUSCULAR | Status: DC

## 2022-06-04 MED ORDER — PANTOPRAZOLE SODIUM 40 MG PO TBEC: 40.0000 mg | DELAYED_RELEASE_TABLET | Freq: Every day | ORAL | Status: DC

## 2022-06-04 MED ORDER — LIDOCAINE 2% (20 MG/ML) 5 ML SYRINGE
INTRAMUSCULAR | Status: DC | PRN
  Administered 2022-06-04: 80 mg via INTRAVENOUS

## 2022-06-04 MED ORDER — PANTOPRAZOLE SODIUM 40 MG PO TBEC
40.0000 mg | DELAYED_RELEASE_TABLET | Freq: Every day | ORAL | Status: DC
  Administered 2022-06-05 – 2022-06-07 (×3): 40 mg via ORAL
  Filled 2022-06-04 (×3): qty 1

## 2022-06-04 MED ORDER — ROCURONIUM BROMIDE 10 MG/ML (PF) SYRINGE
PREFILLED_SYRINGE | INTRAVENOUS | Status: DC | PRN
  Administered 2022-06-04: 50 mg via INTRAVENOUS
  Administered 2022-06-04: 10 mg via INTRAVENOUS

## 2022-06-04 MED ORDER — BISACODYL 10 MG RE SUPP: 10.0000 mg | Freq: Every day | RECTAL | Status: DC | PRN

## 2022-06-04 MED ORDER — SODIUM CHLORIDE 0.9 % IR SOLN
Status: DC | PRN
  Administered 2022-06-04 (×2): 3000 mL

## 2022-06-04 MED ORDER — MORPHINE SULFATE (PF) 2 MG/ML IV SOLN: 0.5000 mg | INTRAVENOUS | Status: DC | PRN

## 2022-06-04 MED ORDER — ATENOLOL 50 MG PO TABS: 100.0000 mg | ORAL_TABLET | Freq: Two times a day (BID) | ORAL | Status: DC

## 2022-06-04 MED ORDER — COLCHICINE 0.6 MG PO TABS
0.6000 mg | ORAL_TABLET | Freq: Every day | ORAL | Status: DC
  Administered 2022-06-05 – 2022-06-07 (×3): 0.6 mg via ORAL
  Filled 2022-06-04 (×3): qty 1

## 2022-06-04 MED ORDER — ALBUTEROL SULFATE (2.5 MG/3ML) 0.083% IN NEBU: 2.5000 mg | INHALATION_SOLUTION | RESPIRATORY_TRACT | Status: DC | PRN

## 2022-06-04 MED ORDER — HYDROCODONE-ACETAMINOPHEN 5-325 MG PO TABS: 1.0000 | ORAL_TABLET | Freq: Four times a day (QID) | ORAL | Status: DC | PRN

## 2022-06-04 SURGICAL SUPPLY — 65 items
ANCHOR SUT KEITH ABD SZ2 STR (SUTURE) ×2 IMPLANT
BAG COUNTER SPONGE SURGICOUNT (BAG) IMPLANT
BAG SPEC THK2 15X12 ZIP CLS (MISCELLANEOUS) ×1
BAG SPNG CNTER NS LX DISP (BAG)
BAG ZIPLOCK 12X15 (MISCELLANEOUS) ×2 IMPLANT
BNDG CMPR 5X62 HK CLSR LF (GAUZE/BANDAGES/DRESSINGS) ×1
BNDG CMPR 9X4 STRL LF SNTH (GAUZE/BANDAGES/DRESSINGS) ×1
BNDG CMPR MED 10X6 ELC LF (GAUZE/BANDAGES/DRESSINGS) ×1
BNDG ELASTIC 6INX 5YD STR LF (GAUZE/BANDAGES/DRESSINGS) ×2 IMPLANT
BNDG ELASTIC 6X10 VLCR STRL LF (GAUZE/BANDAGES/DRESSINGS) IMPLANT
BNDG ESMARK 4X9 LF (GAUZE/BANDAGES/DRESSINGS) ×2 IMPLANT
BNDG GZE 12X3 1 PLY HI ABS (GAUZE/BANDAGES/DRESSINGS) ×1
BNDG STRETCH GAUZE 3IN X12FT (GAUZE/BANDAGES/DRESSINGS) ×2 IMPLANT
CNTNR URN SCR LID CUP LEK RST (MISCELLANEOUS) IMPLANT
CONT SPEC 4OZ STRL OR WHT (MISCELLANEOUS) ×2
COVER SURGICAL LIGHT HANDLE (MISCELLANEOUS) ×2 IMPLANT
CUFF TOURN SGL QUICK 34 (TOURNIQUET CUFF) ×1
CUFF TRNQT CYL 34X4.125X (TOURNIQUET CUFF) ×2 IMPLANT
DRAIN CHANNEL 10F 3/8 F FF (DRAIN) IMPLANT
DRAPE C-ARM 42X120 X-RAY (DRAPES) IMPLANT
DRAPE OEC MINIVIEW 54X84 (DRAPES) IMPLANT
DRAPE SHEET LG 3/4 BI-LAMINATE (DRAPES) ×2 IMPLANT
DRAPE U-SHAPE 47X51 STRL (DRAPES) ×2 IMPLANT
DRSG ADAPTIC 3X8 NADH LF (GAUZE/BANDAGES/DRESSINGS) ×2 IMPLANT
DURAPREP 26ML APPLICATOR (WOUND CARE) ×2 IMPLANT
ELECT REM PT RETURN 15FT ADLT (MISCELLANEOUS) ×2 IMPLANT
EVACUATOR SILICONE 100CC (DRAIN) IMPLANT
FACESHIELD WRAPAROUND (MASK) IMPLANT
FACESHIELD WRAPAROUND OR TEAM (MASK) ×2 IMPLANT
GAUZE PAD ABD 8X10 STRL (GAUZE/BANDAGES/DRESSINGS) ×2 IMPLANT
GAUZE SPONGE 4X4 12PLY STRL (GAUZE/BANDAGES/DRESSINGS) ×2 IMPLANT
GLOVE BIOGEL PI IND STRL 8 (GLOVE) ×2 IMPLANT
GLOVE ECLIPSE 8.0 STRL XLNG CF (GLOVE) ×4 IMPLANT
GLOVE ORTHO TXT STRL SZ7.5 (GLOVE) ×2 IMPLANT
KIT BASIN OR (CUSTOM PROCEDURE TRAY) ×2 IMPLANT
KIT TURNOVER KIT A (KITS) IMPLANT
NDL HYPO 21X1.5 SAFETY (NEEDLE) IMPLANT
NDL HYPO 22X1.5 SAFETY MO (MISCELLANEOUS) ×2 IMPLANT
NDL MAYO CATGUT SZ4 TPR NDL (NEEDLE) ×2 IMPLANT
NEEDLE HYPO 21X1.5 SAFETY (NEEDLE) ×1 IMPLANT
NEEDLE HYPO 22X1.5 SAFETY MO (MISCELLANEOUS) IMPLANT
NEEDLE MAYO CATGUT SZ4 (NEEDLE) IMPLANT
NEEDLE SAFETY HYPO 22GAX1.5 (MISCELLANEOUS)
NS IRRIG 1000ML POUR BTL (IV SOLUTION) ×2 IMPLANT
PACK ORTHO EXTREMITY (CUSTOM PROCEDURE TRAY) ×2 IMPLANT
PACKING GAUZE IODOFORM 1INX5YD (GAUZE/BANDAGES/DRESSINGS) ×2 IMPLANT
PAD CAST 4YDX4 CTTN HI CHSV (CAST SUPPLIES) ×4 IMPLANT
PADDING CAST COTTON 4X4 STRL (CAST SUPPLIES) ×1
PENCIL SMOKE EVACUATOR (MISCELLANEOUS) IMPLANT
PROTECTOR NERVE ULNAR (MISCELLANEOUS) ×2 IMPLANT
SET IRRIG Y TYPE TUR BLADDER L (SET/KITS/TRAYS/PACK) IMPLANT
SPIKE FLUID TRANSFER (MISCELLANEOUS) ×2 IMPLANT
SPONGE T-LAP 4X18 ~~LOC~~+RFID (SPONGE) ×4 IMPLANT
STAPLER VISISTAT 35W (STAPLE) ×2 IMPLANT
STRIP CLOSURE SKIN 1/2X4 (GAUZE/BANDAGES/DRESSINGS) ×2 IMPLANT
SUT ETHILON 2 0 PS N (SUTURE) ×4 IMPLANT
SUT PROLENE 3 0 PS 2 (SUTURE) ×2 IMPLANT
SUT VIC AB 2-0 CT1 27 (SUTURE)
SUT VIC AB 2-0 CT1 TAPERPNT 27 (SUTURE) ×2 IMPLANT
SUT VIC AB 3-0 PS2 18 (SUTURE)
SUT VIC AB 3-0 PS2 18XBRD (SUTURE) ×2 IMPLANT
SWAB COLLECTION DEVICE MRSA (MISCELLANEOUS) ×2 IMPLANT
SWAB CULTURE ESWAB REG 1ML (MISCELLANEOUS) ×2 IMPLANT
SYR CONTROL 10ML LL (SYRINGE) ×2 IMPLANT
WATER STERILE IRR 1000ML POUR (IV SOLUTION) ×2 IMPLANT

## 2022-06-04 NOTE — Progress Notes (Addendum)
Pharmacy Antibiotic Note  Connie Castaneda is a 52 y.o. female admitted on 06/04/2022 with  LLE abscess s/p surgical I&D .  Pharmacy has been consulted for Vancomycin & Cefepime dosing.  Plan: Cefepime 2gm IV q8h Vanocmycin 1gm IV q12h to target AUC 400-550. Calculated AUC 491 Monitor renal function and cx data  Check Vancomycin levels at steady state  Height: '5\' 6"'$  (167.6 cm) IBW/kg (Calculated) : 59.3  Temp (24hrs), Avg:99.2 F (37.3 C), Min:98.2 F (36.8 C), Max:100.6 F (38.1 C)  Recent Labs  Lab 06/04/22 1405  WBC 15.5*  CREATININE 0.78  LATICACIDVEN 1.7    Estimated Creatinine Clearance: 108.6 mL/min (by C-G formula based on SCr of 0.78 mg/dL).    No Known Allergies  Antimicrobials this admission: 3/2 Cefepime >>  3/2 Vancomycin >>  3/2 Ancef x1  Dose adjustments this admission:  Microbiology results: 3/2 BCx:   Thank you for allowing pharmacy to be a part of this patient's care.  Netta Cedars PharmD 06/04/2022 10:02 PM

## 2022-06-04 NOTE — ED Triage Notes (Signed)
PT arrived via EMS from home due to left leg pain. She also has some painful urination.   BP 122/88 HR 110 O2 96  CBG 134

## 2022-06-04 NOTE — Op Note (Addendum)
06/04/2022  9:06 PM  PATIENT:  Connie Castaneda  52 y.o. female  PRE-OPERATIVE DIAGNOSIS:   1.  deep left calf abscess      2.  Left knee effusion  POST-OPERATIVE DIAGNOSIS:  same  Procedure(s):  1.  Left knee arthrocentesis   2.  Irrigation and excisional debridement left calf and posterior knee  SURGEON:  Wylene Simmer, MD  ASSISTANT:  none  ANESTHESIA:   General  EBL:  minimal   TOURNIQUET:  less than 1 hour @ 300 mm Hg thigh  COMPLICATIONS:  None apparent  DISPOSITION:  Extubated, awake and stable to recovery.  INDICATION FOR PROCEDURE: 52 year old female with a past medical history significant for morbid obesity complains of worsening left calf pain for the last 10 days.  She presented to the emergency room today.  A CT scan showed a deep abscess of her left calf extending proximally to the posterior knee.  She was also noted to have a left knee effusion.  She presents now for operative treatment of this deep abscess.  The risks and benefits of the alternative treatment options have been discussed in detail.  The patient wishes to proceed with surgery and specifically understands risks of bleeding, infection, nerve damage, blood clots, need for additional surgery, amputation and death.   PROCEDURE IN DETAIL: After preoperative consent was obtained and the correct operative site was identified, the patient was brought to the operating room supine on the stretcher.  General anesthesia was induced.  Preoperative antibiotics were held pending deep cultures.  A surgical timeout was taken.  The left knee was then prepped.  An arthrocentesis was performed with an 18-gauge needle.  10 cc of straw-colored clear fluid was aspirated from the knee.  A sterile bandage was applied.  Thigh tourniquet was then applied.  The extremity was elevated, and the tourniquet was inflated to 300 mmHg.  The patient was then turned into the prone position with all bony prominences padded well.  The left lower  extremity was prepped and draped in standard sterile fashion.  A longitudinal incision was made in the midline of the calf several centimeters distal from the flexion crease of the knee.  Dissection was carried sharply down through the subcutaneous tissues.  The superficial fascia was identified.  It was incised.  The medial and lateral heads of the gastrocnemius were identified.  Blunt dissection was carried through the fibers.  The abscess was entered bluntly.  Copious purulent fluid was expressed from the wound.  Approximately 10 cc were collected and specimen cup and passed off the field as a specimen to microbiology.  Perioperative antibiotics were administered.  In all 150 cc of pus was expressed and suctioned from the abscess.  The abscess cavity extended distally into the medial and lateral heads of the gastrocnemius and proximally to the posterior aspect of the knee joint.  All of the loculations were bluntly broken up.  A Cobb elevator was used to debride the surfaces of the abscess cavity.  The abscess was then irrigated with 6 L of normal saline.  There was no evidence of any purulence or nonviable tissue.  A flat JP drain was placed in the deep portion of the wound and brought out through a separate incision.  It was sutured in place with 2-0 nylon.  The incision was then loosely approximated with horizontal mattress sutures of 2-0 nylon.  Sterile dressings were applied followed by a compression wrap.  The tourniquet was released after application of the  dressings.  The patient was awakened from anesthesia and transported to the recovery room in stable condition.  Debridement type: Excisional Debridement  Side: left  Body Location: calf  Tools used for debridement: scalpel and curette  Pre-debridement Wound size (cm):   Length: 0        Width: 0     Depth: 0   Post-debridement Wound size (cm):   Length: 5 cm        Width: 2 mm     Depth: 3 cm   Debridement depth beyond dead/damaged tissue  down to healthy viable tissue: yes  Tissue layer involved: skin, subcutaneous tissue, muscle / fascia  Nature of tissue removed: Non-viable tissue and Purulence  Irrigation volume: 6 L     Irrigation fluid type: Normal Saline    FOLLOW UP PLAN: The patient will be admitted to the hospitalist service.  Infectious disease consultation will be obtained.  She will start on IV vancomycin and cefepime per pharmacy dosing.  She is safe to bear weight as tolerated.  We will plan to pull the drain in a few days.  DVT prophylaxis starting postop day 1.

## 2022-06-04 NOTE — Progress Notes (Signed)
LLE venous duplex has been completed.  Preliminary results given to Dr. Alvino Chapel.   Results can be found under chart review under CV PROC. 06/04/2022 3:59 PM Iram Lundberg RVT, RDMS

## 2022-06-04 NOTE — ED Provider Notes (Signed)
[facility] Provider Note   CSN: [id number] Arrival date & time: 06/04/22  1312     History  Chief Complaint  Patient presents with   Leg Pain   painful urination    Connie Castaneda is a 52 y.o. female.   Leg Pain Patient presents with left knee and lower leg pain.  Is had for around 2 weeks now.  States she just woke up with that 1 day and thought she had slept on it wrong.  Has been seen by the ER urgent care PCP and Ortho.  States she had an ultrasound that just showed some swelling in her calf.  States there was no clot.  Continued pain and worse swelling.  States had to get crutches because she cannot walk on it.  Did have some hydrocodone that was given and states it did not really help much.  States she has had some chills at times and now more redness. Also has had some dysuria and her states her urine is darker than normal.    Home Medications Prior to Admission medications   Medication Sig Start Date End Date Taking? Authorizing Provider  colchicine 0.6 MG tablet Take 0.6 mg by mouth daily. 05/13/22  Yes [provider]  cyclobenzaprine (FLEXERIL) 10 MG tablet Take 10 mg by mouth 3 (three) times daily. 05/18/22  Yes [provider]  metoprolol tartrate (LOPRESSOR) 50 MG tablet Take 50 mg by mouth daily. 04/20/22  Yes [provider]  atenolol (TENORMIN) 100 MG tablet Take 100 mg by mouth 2 (two) times daily.    [provider]  cephALEXin (KEFLEX) 500 MG capsule Take 1 capsule (500 mg total) by mouth 2 (two) times daily. 08/14/19   Hassell Done Mary-Margaret, FNP  famotidine (PEPCID) 20 MG tablet Take 1 tablet (20 mg total) by mouth 2 (two) times daily. 01/25/22   Jaynee Eagles, PA-C  HYDROcodone-acetaminophen (NORCO/VICODIN) 5-325 MG tablet Take 1 tablet by mouth every 6 (six) hours as needed for severe pain. 05/28/22   Long, Wonda Olds, MD  meloxicam (MOBIC) 15 MG tablet Take 0.5-1 tablets (7.5-15 mg total) by  mouth daily. 05/25/22   Jaynee Eagles, PA-C  metroNIDAZOLE (FLAGYL) 500 MG tablet Take 1 tablet (500 mg total) by mouth 2 (two) times daily. One po bid x 7 days 10/06/17   Street, Ringoes, PA-C  omeprazole (PRILOSEC) 40 MG capsule Take 1 capsule (40 mg total) by mouth daily. 05/24/14   Hess, Hessie Diener, PA-C  omeprazole (PRILOSEC) 40 MG capsule Take 1 capsule (40 mg total) by mouth 2 (two) times daily. 01/25/22   Jaynee Eagles, PA-C  ondansetron (ZOFRAN-ODT) 4 MG disintegrating tablet Take 1 tablet (4 mg total) by mouth every 8 (eight) hours as needed. 05/28/22   Long, Wonda Olds, MD  predniSONE (DELTASONE) 20 MG tablet Take 2 tablets daily with breakfast. 01/25/22   Jaynee Eagles, PA-C  selenium sulfide (SELSUN) 2.5 % shampoo Apply 1 application topically daily as needed for irritation. 12/19/19   Fawze, Mina A, PA-C  senna-docusate (SENOKOT-S) 8.6-50 MG tablet Take 1 tablet by mouth at bedtime as needed for mild constipation. 05/28/22   Long, Wonda Olds, MD  tiZANidine (ZANAFLEX) 4 MG tablet Take 1 tablet (4 mg total) by mouth at bedtime. 05/25/22   Jaynee Eagles, PA-C  triamcinolone cream (KENALOG) 0.1 % Apply 1 application topically 2 (two) times daily. 06/14/19   Sharion Balloon, FNP  triamterene-hydrochlorothiazide (MAXZIDE-25) 37.5-25 MG tablet Take 1 tablet  by mouth daily. 07/31/21   [provider]      Allergies    Patient has no known allergies.    Review of Systems   Review of Systems  Physical Exam Updated Vital Signs BP 136/77 (BP Location: Left Arm)   Pulse (!) 133   Temp 98.6 F (37 C) (Oral)   Resp 19   Ht '5\' 6"'$  (1.676 m)   LMP 07/03/2017   SpO2 100%   BMI 41.97 kg/m  Physical Exam Vitals reviewed.  Abdominal:     Tenderness: There is no abdominal tenderness.  Musculoskeletal:        General: Tenderness present.     Cervical back: Neck supple.     Comments: Left lower leg is larger than contralateral side.  There is swelling and tenderness over the calf and there is erythema  posteriorly and somewhat inferiorly.  No subcu emphysema or crepitance.  No tenderness behind the knee.  Neurological:     Mental Status: She is alert.        ED Results / Procedures / Treatments   Labs (all labs ordered are listed, but only abnormal results are displayed) Labs Reviewed  COMPREHENSIVE METABOLIC PANEL - Abnormal; Notable for the following components:      Result Value   Sodium 133 (*)    Chloride 94 (*)    Glucose, Bld 116 (*)    Calcium 8.2 (*)    Total Protein 8.6 (*)    Albumin 2.8 (*)    All other components within normal limits  CBC WITH DIFFERENTIAL/PLATELET - Abnormal; Notable for the following components:   WBC 15.5 (*)    Neutro Abs 12.6 (*)    Abs Immature Granulocytes 0.19 (*)    All other components within normal limits  CULTURE, BLOOD (ROUTINE X 2)  CULTURE, BLOOD (ROUTINE X 2)  LACTIC ACID, PLASMA  CK  LACTIC ACID, PLASMA  URINALYSIS, W/ REFLEX TO CULTURE (INFECTION SUSPECTED)    EKG None  Radiology VAS [place] LOWER EXTREMITY VENOUS (DVT)  Result Date: 06/04/2022  Lower Venous DVT Study Patient Name:  Connie Castaneda  Date of Exam:   06/04/2022 Medical Rec #: [id number]    Accession #:    [id number] Date of Birth: 02/13/1971    Patient Gender: F Patient Age:   59 years Exam Location:  [facility] Procedure:      VAS [place] LOWER EXTREMITY VENOUS (DVT) Referring Phys: Davonna Belling --------------------------------------------------------------------------------  Indications: Pain, and Swelling.  Limitations: Poor ultrasound/tissue interface and pain intolerance. Comparison Study: Previous exam on 05/27/22 was negative for DVT Performing Technologist: Rogelia Rohrer RVT, RDMS  Examination Guidelines: A complete evaluation includes B-mode imaging, spectral Doppler, color Doppler, and power Doppler as needed of all accessible portions of each vessel. Bilateral testing is considered an integral part of a complete examination. Limited examinations for reoccurring  indications may be performed as noted. The reflux portion of the exam is performed with the patient in reverse Trendelenburg.  +-----+---------------+---------+-----------+----------+--------------+ RIGHTCompressibilityPhasicitySpontaneityPropertiesThrombus Aging +-----+---------------+---------+-----------+----------+--------------+ CFV  Full           Yes      Yes                                 +-----+---------------+---------+-----------+----------+--------------+   +---------+---------------+---------+-----------+----------+-------------------+ LEFT     CompressibilityPhasicitySpontaneityPropertiesThrombus Aging      +---------+---------------+---------+-----------+----------+-------------------+ CFV      Full  Yes      Yes                                      +---------+---------------+---------+-----------+----------+-------------------+ SFJ      Full                                                             +---------+---------------+---------+-----------+----------+-------------------+ FV Prox  Full           Yes      Yes                                      +---------+---------------+---------+-----------+----------+-------------------+ FV Mid   Full           Yes      Yes                                      +---------+---------------+---------+-----------+----------+-------------------+ FV DistalFull           Yes      Yes                                      +---------+---------------+---------+-----------+----------+-------------------+ PFV      Full                                                             +---------+---------------+---------+-----------+----------+-------------------+ POP      Full           Yes      Yes                                      +---------+---------------+---------+-----------+----------+-------------------+ PTV                                                   Not well visualized  +---------+---------------+---------+-----------+----------+-------------------+ PERO                                                  not visualized      +---------+---------------+---------+-----------+----------+-------------------+  Left Technical Findings: Not visualized segments include peroneal veins.   Summary: RIGHT: - No evidence of common femoral vein obstruction.  LEFT: - There is no evidence of deep vein thrombosis in the lower extremity. However, portions of this examination were limited- see technologist comments above.  - No cystic structure found in the popliteal fossa.  *See table(s) above for measurements and observations. Electronically  signed by Monica Martinez MD on 06/04/2022 at 5:18:03 PM.    Final    CT EXTREMITY LOWER LEFT W CONTRAST  Result Date: 06/04/2022 CLINICAL DATA:  Left leg pain. EXAM: CT OF THE LOWER LEFT EXTREMITY WITH CONTRAST TECHNIQUE: Multidetector CT imaging of the lower left extremity was performed according to the standard protocol following intravenous contrast administration. RADIATION DOSE REDUCTION: This exam was performed according to the departmental dose-optimization program which includes automated exposure control, adjustment of the mA and/or kV according to patient size and/or use of iterative reconstruction technique. CONTRAST:  148m OMNIPAQUE IOHEXOL 300 MG/ML  SOLN COMPARISON:  Left tibia and fibula x-rays from same day. Left knee x-rays dated May 28, 2022. FINDINGS: Bones/Joint/Cartilage No fracture or dislocation. Joint spaces are preserved. Moderate knee joint effusion. Ligaments Ligaments are suboptimally evaluated by CT. Muscles and Tendons Large multiloculated rim enhancing fluid collection involving the medial and lateral gastrocnemius muscles, measuring approximately 5.1 x 9.6 x 17.1 cm. Soft tissue Scattered superficial soft tissue swelling. No subcutaneous emphysema. No soft tissue mass. IMPRESSION: 1. Large multiloculated rim  enhancing fluid collection involving the medial and lateral gastrocnemius muscles, concerning for intramuscular abscess. Myonecrosis is less likely given the absence of diabetes and normal CK level. 2. Moderate knee joint effusion, nonspecific. Consider arthrocentesis. Electronically Signed   By: WTitus DubinM.D.   On: 06/04/2022 17:17   DG Tibia/Fibula Left  Result Date: 06/04/2022 CLINICAL DATA:  Swelling without injury EXAM: LEFT TIBIA AND FIBULA - 2 VIEW COMPARISON:  None Available. FINDINGS: An anchor in the posterior calcaneus is likely from Achilles repair. Soft tissue swelling identified. No foreign bodies. No soft tissue gas. No fracture or dislocation. No bony erosion. IMPRESSION: Soft tissue swelling. No other acute abnormalities. Electronically Signed   By: DDorise BullionIII M.D.   On: 06/04/2022 14:02    Procedures Procedures    Medications Ordered in ED Medications  sodium chloride 0.9 % bolus 1,000 mL (has no administration in time range)  HYDROmorphone (DILAUDID) injection 0.5 mg (has no administration in time range)  HYDROmorphone (DILAUDID) injection 0.5 mg (0.5 mg Intravenous Given 06/04/22 1429)  ondansetron (ZOFRAN) injection 4 mg (4 mg Intravenous Given 06/04/22 1429)  iohexol (OMNIPAQUE) 300 MG/ML solution 100 mL (100 mLs Intravenous Contrast Given 06/04/22 1635)    ED Course/ Medical Decision Making/ A&P                             Medical Decision Making Amount and/or Complexity of Data Reviewed Labs: ordered. Radiology: ordered.  Risk Prescription drug management. Decision regarding hospitalization.   Patient with left calf pain.  Is had for the last week and a half.  Has been seen in the ER urgent care PCP and Ortho.  Reportedly saw [facility] and they thought maybe she would need an MRI.  Now had some chills.  Now redness and swelling.  This does put worry for infection.  Has had previous Doppler but Doppler done here to evaluate for DVT.  No DVT.   However CT scan done and does show fluid collection likely abscess.  However is overall pretty low risk for an abscess.  No IV drug use.  Not diabetic.  Has had some mild tachycardia.  White count is elevated but lactic acid reassuring.  Initially discussed with Dr. HDoreatha Martin  He believes this should go to the provider on-call.  Discussed with Dr. HDoran Durandwho will take patient  to the OR.  For now we will hold off on antibiotics to get an accurate culture.  Does not appear septic at this time.  Will give some more pain medicine.  Will discuss with internal medicine for admission also.  Since unknown reason for abscess Ortho believes patient would be a better medicine admission.        Final Clinical Impression(s) / ED Diagnoses Final diagnoses:  Abscess    Rx / DC Orders ED Discharge Orders     None         Davonna Belling, MD 06/04/22 1824

## 2022-06-04 NOTE — Anesthesia Preprocedure Evaluation (Addendum)
Anesthesia Evaluation  Patient identified by MRN, date of birth, ID band Patient awake    Reviewed: Allergy & Precautions, NPO status , Patient's Chart, lab work & pertinent test results  Airway Mallampati: II  TM Distance: >3 FB Neck ROM: Full    Dental no notable dental hx.    Pulmonary neg pulmonary ROS   Pulmonary exam normal        Cardiovascular hypertension, Pt. on medications and Pt. on home beta blockers  Rhythm:Regular Rate:Normal     Neuro/Psych negative neurological ROS  negative psych ROS   GI/Hepatic Neg liver ROS,GERD  Medicated,,  Endo/Other  negative endocrine ROS    Renal/GU negative Renal ROS  negative genitourinary   Musculoskeletal Calf abscess   Abdominal Normal abdominal exam  (+)   Peds  Hematology negative hematology ROS (+)   Anesthesia Other Findings   Reproductive/Obstetrics                             Anesthesia Physical Anesthesia Plan  ASA: 2 and emergent  Anesthesia Plan: General   Post-op Pain Management:    Induction:   PONV Risk Score and Plan: 3 and Ondansetron, Dexamethasone, Midazolam and Treatment may vary due to age or medical condition  Airway Management Planned: Mask and Oral ETT  Additional Equipment: None  Intra-op Plan:   Post-operative Plan: Extubation in OR  Informed Consent: I have reviewed the patients History and Physical, chart, labs and discussed the procedure including the risks, benefits and alternatives for the proposed anesthesia with the patient or authorized representative who has indicated his/her understanding and acceptance.     Dental advisory given  Plan Discussed with: CRNA  Anesthesia Plan Comments:        Anesthesia Quick Evaluation

## 2022-06-04 NOTE — Transfer of Care (Signed)
Immediate Anesthesia Transfer of Care Note  Patient: Connie Castaneda  Procedure(s) Performed: Procedure(s): IRRIGATION AND DEBRIDEMENT EXTREMITY (Left)  Patient Location: PACU  Anesthesia Type:General  Level of Consciousness:  sedated, patient cooperative and responds to stimulation  Airway & Oxygen Therapy:Patient Spontanous Breathing and Patient connected to face mask oxgen  Post-op Assessment:  Report given to PACU RN and Post -op Vital signs reviewed and stable  Post vital signs:  Reviewed and stable  Last Vitals:  Vitals:   06/04/22 1930 06/04/22 1945  BP: 128/61 129/80  Pulse: (!) 118 (!) 116  Resp: 13 (!) 22  Temp:    SpO2: 0000000 0000000    Complications: No apparent anesthesia complications

## 2022-06-04 NOTE — H&P (Signed)
History and Physical    Connie Castaneda [id number] DOB: 1971/01/25 DOA: 06/04/2022  PCP: Lin Landsman, MD  Patient coming from: home  I have personally briefly reviewed patient's old medical records in [place]  Chief Complaint:  left leg pain x 2 weeks  HPI: Connie Castaneda is a 52 y.o. female with medical history significant of  GERD, Hypertension , obesity who presents to ED BIB EMS with two weeks history of left lower extremity calf pain with progressive swelling and redness x 2 weeks. Patient states  3 days after symptom onset she was seen and had u/s of her lower extremity completed which was noted to be negative. Patient states at that time they diagnosed her with a bakers cyst and prescribed supportive pain medications. Patient notes through her course she has had progression of symptoms and noted intermittent fever chills as well as nausea , low appetite and generalized malaise.  Patient presents to ED today due to severe  pain symptoms not relieved by home treatment. She also note dysuria, as well as dark urine  and continued fever and chills  ED Course:  10.6 bp 101/53, hr128, rr 16 sat 97% on ra  Left tib/fib xray Soft tissue swelling  Labs:  Na 133, K 3.9, cr 0.78,  CK 103  Latic 1.7  N a 15.5, hgb 12.4, plt 341 U/s: negative CT leg PRESSION: 1. Large multiloculated rim enhancing fluid collection involving the medial and lateral gastrocnemius muscles, concerning for intramuscular abscess. Myonecrosis is less likely given the absence of diabetes and normal CK level. 2. Moderate knee joint effusion, nonspecific. Consider arthrocentesis.  Tx Zofran ,dilaudid Review of Systems: As per HPI otherwise 10 point review of systems negative.   Past Medical History:  Diagnosis Date   GERD (gastroesophageal reflux disease)    Hypertension     Past Surgical History:  Procedure Laterality Date   CESAREAN SECTION     CHOLECYSTECTOMY     FOOT SURGERY     LAPAROSCOPIC  GASTRIC SLEEVE RESECTION       reports that she has never smoked. She has never used smokeless tobacco. She reports that she does not drink alcohol and does not use drugs.  No Known Allergies  FH Diabetes  Prior to Admission medications   Medication Sig Start Date End Date Taking? Authorizing Provider  colchicine 0.6 MG tablet Take 0.6 mg by mouth daily. 05/13/22  Yes [provider]  cyclobenzaprine (FLEXERIL) 10 MG tablet Take 10 mg by mouth 3 (three) times daily. 05/18/22  Yes [provider]  metoprolol tartrate (LOPRESSOR) 50 MG tablet Take 50 mg by mouth daily. 04/20/22  Yes [provider]  atenolol (TENORMIN) 100 MG tablet Take 100 mg by mouth 2 (two) times daily.    [provider]  cephALEXin (KEFLEX) 500 MG capsule Take 1 capsule (500 mg total) by mouth 2 (two) times daily. 08/14/19   Hassell Done Mary-Margaret, FNP  famotidine (PEPCID) 20 MG tablet Take 1 tablet (20 mg total) by mouth 2 (two) times daily. 01/25/22   Jaynee Eagles, PA-C  HYDROcodone-acetaminophen (NORCO/VICODIN) 5-325 MG tablet Take 1 tablet by mouth every 6 (six) hours as needed for severe pain. 05/28/22   Long, Wonda Olds, MD  meloxicam (MOBIC) 15 MG tablet Take 0.5-1 tablets (7.5-15 mg total) by mouth daily. 05/25/22   Jaynee Eagles, PA-C  metroNIDAZOLE (FLAGYL) 500 MG tablet Take 1 tablet (500 mg total) by mouth 2 (two) times daily. One po bid x 7 days  10/06/17   Street, Mercedes, PA-C  omeprazole (PRILOSEC) 40 MG capsule Take 1 capsule (40 mg total) by mouth daily. 05/24/14   Hess, Hessie Diener, PA-C  omeprazole (PRILOSEC) 40 MG capsule Take 1 capsule (40 mg total) by mouth 2 (two) times daily. 01/25/22   Jaynee Eagles, PA-C  ondansetron (ZOFRAN-ODT) 4 MG disintegrating tablet Take 1 tablet (4 mg total) by mouth every 8 (eight) hours as needed. 05/28/22   Long, Wonda Olds, MD  predniSONE (DELTASONE) 20 MG tablet Take 2 tablets daily with breakfast. 01/25/22   Jaynee Eagles, PA-C  selenium sulfide (SELSUN)  2.5 % shampoo Apply 1 application topically daily as needed for irritation. 12/19/19   Fawze, Mina A, PA-C  senna-docusate (SENOKOT-S) 8.6-50 MG tablet Take 1 tablet by mouth at bedtime as needed for mild constipation. 05/28/22   Long, Wonda Olds, MD  tiZANidine (ZANAFLEX) 4 MG tablet Take 1 tablet (4 mg total) by mouth at bedtime. 05/25/22   Jaynee Eagles, PA-C  triamcinolone cream (KENALOG) 0.1 % Apply 1 application topically 2 (two) times daily. 06/14/19   Sharion Balloon, FNP  triamterene-hydrochlorothiazide (MAXZIDE-25) 37.5-25 MG tablet Take 1 tablet by mouth daily. 07/31/21   [provider]    Physical Exam: Vitals:   06/04/22 1724 06/04/22 1847 06/04/22 1900 06/04/22 1915  BP: 136/77 (!) 94/41 106/74   Pulse: (!) 133 (!) 128 (!) 124 (!) 120  Resp: '19 15 15 '$ (!) 23  Temp: 98.6 F (37 C) (!) 100.6 F (38.1 C)    TempSrc: Oral     SpO2: 100% 98% 96% 96%  Height:        Constitutional: distress due to pain  Vitals:   06/04/22 1724 06/04/22 1847 06/04/22 1900 06/04/22 1915  BP: 136/77 (!) 94/41 106/74   Pulse: (!) 133 (!) 128 (!) 124 (!) 120  Resp: '19 15 15 '$ (!) 23  Temp: 98.6 F (37 C) (!) 100.6 F (38.1 C)    TempSrc: Oral     SpO2: 100% 98% 96% 96%  Height:       Eyes: PERRL, lids and conjunctivae normal ENMT: Mucous membranes are dry  Posterior pharynx clear of any exudate or lesions.Normal dentition.  Neck: normal, supple, no masses, no thyromegaly Respiratory: clear to auscultation bilaterally, no wheezing, no crackles. Normal respiratory effort. No accessory muscle use.  Cardiovascular: Regular rate and rhythm, no murmrurs / rubs / gallops. No extremity edema. 2+ pedal pulses. .  Abdomen: obese o tenderness, no masses palpated. No hepatosplenomegaly. Bowel sounds positive.  Musculoskeletal: no clubbing / cyanosis left calf swelling and redness , very tender to touch. Good ROM, no contractures. Normal muscle tone.  Skin: no rashes, lesions, ulcers. No  induration Neurologic: CN 2-12 grossly intact. Sensation intact, . Strength 5/5 in all 4.  Psychiatric: Normal judgment and insight. Alert and oriented x 3. Normal mood.    Labs on Admission: I have personally reviewed following labs and imaging studies  CBC: Recent Labs  Lab 06/04/22 1405  WBC 15.5*  NEUTROABS 12.6*  HGB 12.4  HCT 40.8  MCV 90.5  PLT [id number]   Basic Metabolic Panel: Recent Labs  Lab 06/04/22 1405  NA 133*  K 3.9  CL 94*  CO2 28  GLUCOSE 116*  BUN 10  CREATININE 0.78  CALCIUM 8.2*   GFR: Estimated Creatinine Clearance: 108.6 mL/min (by C-G formula based on SCr of 0.78 mg/dL). Liver Function Tests: Recent Labs  Lab 06/04/22 1405  AST 22  ALT 13  ALKPHOS 92  BILITOT 0.7  PROT 8.6*  ALBUMIN 2.8*   No results for input(s): "LIPASE", "AMYLASE" in the last 168 hours. No results for input(s): "AMMONIA" in the last 168 hours. Coagulation Profile: No results for input(s): "INR", "PROTIME" in the last 168 hours. Cardiac Enzymes: Recent Labs  Lab 06/04/22 1405  CKTOTAL 103   BNP (last 3 results) No results for input(s): "PROBNP" in the last 8760 hours. HbA1C: No results for input(s): "HGBA1C" in the last 72 hours. CBG: No results for input(s): "GLUCAP" in the last 168 hours. Lipid Profile: No results for input(s): "CHOL", "HDL", "LDLCALC", "TRIG", "CHOLHDL", "LDLDIRECT" in the last 72 hours. Thyroid Function Tests: No results for input(s): "TSH", "T4TOTAL", "FREET4", "T3FREE", "THYROIDAB" in the last 72 hours. Anemia Panel: No results for input(s): "VITAMINB12", "FOLATE", "FERRITIN", "TIBC", "IRON", "RETICCTPCT" in the last 72 hours. Urine analysis:    Component Value Date/Time   COLORURINE YELLOW 03/21/2017 1537   APPEARANCEUR CLEAR 03/21/2017 1537   LABSPEC 1.017 03/21/2017 1537   PHURINE 5.0 03/21/2017 1537   GLUCOSEU NEGATIVE 03/21/2017 1537   HGBUR NEGATIVE 03/21/2017 1537   BILIRUBINUR NEGATIVE 03/21/2017 1537   KETONESUR NEGATIVE  03/21/2017 1537   PROTEINUR NEGATIVE 03/21/2017 1537   UROBILINOGEN 0.2 07/18/2013 2056   NITRITE NEGATIVE 03/21/2017 1537   LEUKOCYTESUR NEGATIVE 03/21/2017 1537    Radiological Exams on Admission: VAS [place] LOWER EXTREMITY VENOUS (DVT)  Result Date: 06/04/2022  Lower Venous DVT Study Patient Name:  Connie Castaneda  Date of Exam:   06/04/2022 Medical Rec #: [id number]    Accession #:    [id number] Date of Birth: 1970-07-23    Patient Gender: F Patient Age:   38 years Exam [facility]:  [facility] Procedure:      VAS [place] LOWER EXTREMITY VENOUS (DVT) Referring Phys: Davonna Belling --------------------------------------------------------------------------------  Indications: Pain, and Swelling.  Limitations: Poor ultrasound/tissue interface and pain intolerance. Comparison Study: Previous exam on 05/27/22 was negative for DVT Performing Technologist: Rogelia Rohrer RVT, RDMS  Examination Guidelines: A complete evaluation includes B-mode imaging, spectral Doppler, color Doppler, and power Doppler as needed of all accessible portions of each vessel. Bilateral testing is considered an integral part of a complete examination. Limited examinations for reoccurring indications may be performed as noted. The reflux portion of the exam is performed with the patient in reverse Trendelenburg.  +-----+---------------+---------+-----------+----------+--------------+ RIGHTCompressibilityPhasicitySpontaneityPropertiesThrombus Aging +-----+---------------+---------+-----------+----------+--------------+ CFV  Full           Yes      Yes                                 +-----+---------------+---------+-----------+----------+--------------+   +---------+---------------+---------+-----------+----------+-------------------+ LEFT     CompressibilityPhasicitySpontaneityPropertiesThrombus Aging      +---------+---------------+---------+-----------+----------+-------------------+ CFV      Full           Yes      Yes                                       +---------+---------------+---------+-----------+----------+-------------------+ SFJ      Full                                                             +---------+---------------+---------+-----------+----------+-------------------+  FV Prox  Full           Yes      Yes                                      +---------+---------------+---------+-----------+----------+-------------------+ FV Mid   Full           Yes      Yes                                      +---------+---------------+---------+-----------+----------+-------------------+ FV DistalFull           Yes      Yes                                      +---------+---------------+---------+-----------+----------+-------------------+ PFV      Full                                                             +---------+---------------+---------+-----------+----------+-------------------+ POP      Full           Yes      Yes                                      +---------+---------------+---------+-----------+----------+-------------------+ PTV                                                   Not well visualized +---------+---------------+---------+-----------+----------+-------------------+ PERO                                                  not visualized      +---------+---------------+---------+-----------+----------+-------------------+  Left Technical Findings: Not visualized segments include peroneal veins.   Summary: RIGHT: - No evidence of common femoral vein obstruction.  LEFT: - There is no evidence of deep vein thrombosis in the lower extremity. However, portions of this examination were limited- see technologist comments above.  - No cystic structure found in the popliteal fossa.  *See table(s) above for measurements and observations. Electronically signed by Monica Martinez MD on 06/04/2022 at 5:18:03 PM.    Final    CT EXTREMITY LOWER LEFT W  CONTRAST  Result Date: 06/04/2022 CLINICAL DATA:  Left leg pain. EXAM: CT OF THE LOWER LEFT EXTREMITY WITH CONTRAST TECHNIQUE: Multidetector CT imaging of the lower left extremity was performed according to the standard protocol following intravenous contrast administration. RADIATION DOSE REDUCTION: This exam was performed according to the departmental dose-optimization program which includes automated exposure control, adjustment of the mA and/or kV according to patient size and/or use of iterative reconstruction technique. CONTRAST:  145m OMNIPAQUE IOHEXOL 300 MG/ML  SOLN COMPARISON:  Left tibia and fibula x-rays from  same day. Left knee x-rays dated May 28, 2022. FINDINGS: Bones/Joint/Cartilage No fracture or dislocation. Joint spaces are preserved. Moderate knee joint effusion. Ligaments Ligaments are suboptimally evaluated by CT. Muscles and Tendons Large multiloculated rim enhancing fluid collection involving the medial and lateral gastrocnemius muscles, measuring approximately 5.1 x 9.6 x 17.1 cm. Soft tissue Scattered superficial soft tissue swelling. No subcutaneous emphysema. No soft tissue mass. IMPRESSION: 1. Large multiloculated rim enhancing fluid collection involving the medial and lateral gastrocnemius muscles, concerning for intramuscular abscess. Myonecrosis is less likely given the absence of diabetes and normal CK level. 2. Moderate knee joint effusion, nonspecific. Consider arthrocentesis. Electronically Signed   By: Titus Dubin M.D.   On: 06/04/2022 17:17   DG Tibia/Fibula Left  Result Date: 06/04/2022 CLINICAL DATA:  Swelling without injury EXAM: LEFT TIBIA AND FIBULA - 2 VIEW COMPARISON:  None Available. FINDINGS: An anchor in the posterior calcaneus is likely from Achilles repair. Soft tissue swelling identified. No foreign bodies. No soft tissue gas. No fracture or dislocation. No bony erosion. IMPRESSION: Soft tissue swelling. No other acute abnormalities. Electronically  Signed   By: Dorise Bullion III M.D.   On: 06/04/2022 14:02    EKG: Independently reviewed.   Assessment/Plan Principal Problem:   Abscess of left lower extremity   Left lower extremity Calf Abscess  -etiology of infection on clear at this time  -start broad spectrum abx unasyn and vanc  --f/u on culture data  -continue to monitor crp  trend   Hypertension  -relative hypotension  -hold current meidcations     DVT prophylaxis: heparin Code Status: full/ as discussed per patient wishes in event of cardiac arrest Family Communication: none at bedside Disposition Plan: patient  expected to be admitted greater than 2 midnights  Consults called: Dr Doreatha Martin ortho Admission status: progressive care   Clance Boll MD [facility]  If 7PM-7AM, please contact night-coverage [email or web address] Password [facility]  06/04/2022, 8:06 PM

## 2022-06-04 NOTE — Consult Note (Signed)
Reason for Consult:  left calf pain Referring Physician: Dr. Ardelle Balls Connie Castaneda is an 52 y.o. female.  HPI: 52 y/o female with PMh of morbid obesity c/o L calf pain worsening for at least a week.  She denies any injury to the left lower extremity.  She's had a doppler of her calf negative for DVT.  She has been seen at [facility] ortho urgent care.  No MRI so far.  She c/o worsening pain today and presented to the ER.  She has an elevated WBC.  She denies any h/o diabetes.  She denies any h/o HIV, RA or other immune compromising condition.  She has had gastric bypass surgery.  She is not on any blood thinners or abx.  She last ate prior to arrival in the ER > 5 hours ago.  Past Medical History:  Diagnosis Date   GERD (gastroesophageal reflux disease)    Hypertension     Past Surgical History:  Procedure Laterality Date   CESAREAN SECTION     CHOLECYSTECTOMY     FOOT SURGERY     LAPAROSCOPIC GASTRIC SLEEVE RESECTION      No family history on file.  Social History:  reports that she has never smoked. She has never used smokeless tobacco. She reports that she does not drink alcohol and does not use drugs.  Allergies: No Known Allergies  Medications: I have reviewed the patient's current medications.  Results for orders placed or performed during the hospital encounter of 06/04/22 (from the past 48 hour(s))  Comprehensive metabolic panel     Status: Abnormal   Collection Time: 06/04/22  2:05 PM  Result Value Ref Range   Sodium 133 (L) 135 - 145 mmol/L   Potassium 3.9 3.5 - 5.1 mmol/L   Chloride 94 (L) 98 - 111 mmol/L   CO2 28 22 - 32 mmol/L   Glucose, Bld 116 (H) 70 - 99 mg/dL    Comment: Glucose reference range applies only to samples taken after fasting for at least 8 hours.   BUN 10 6 - 20 mg/dL   Creatinine, Ser 0.78 0.44 - 1.00 mg/dL   Calcium 8.2 (L) 8.9 - 10.3 mg/dL   Total Protein 8.6 (H) 6.5 - 8.1 g/dL   Albumin 2.8 (L) 3.5 - 5.0 g/dL   AST 22 15 - 41 U/L   ALT 13 0 - 44  U/L   Alkaline Phosphatase 92 38 - 126 U/L   Total Bilirubin 0.7 0.3 - 1.2 mg/dL   GFR, Estimated >60 >60 mL/min    Comment: (NOTE) Calculated using the CKD-EPI Creatinine Equation (2021)    Anion gap 11 5 - 15    Comment: Performed at [facility], [place] [address], [place], [place] [address]  Lactic acid, plasma     Status: None   Collection Time: 06/04/22  2:05 PM  Result Value Ref Range   Lactic Acid, Venous 1.7 0.5 - 1.9 mmol/L    Comment: Performed at [facility], [place] [address] [place], [place] [address]  CBC with Differential     Status: Abnormal   Collection Time: 06/04/22  2:05 PM  Result Value Ref Range   WBC 15.5 (H) 4.0 - 10.5 K/uL   RBC 4.51 3.87 - 5.11 MIL/uL   Hemoglobin 12.4 12.0 - 15.0 g/dL   HCT 40.8 36.0 - 46.0 %   MCV 90.5 80.0 - 100.0 fL   MCH 27.5 26.0 - 34.0 pg   MCHC 30.4  30.0 - 36.0 g/dL   RDW 13.3 11.5 - 15.5 %   Platelets 341 150 - 400 K/uL   nRBC 0.0 0.0 - 0.2 %   Neutrophils Relative % 81 %   Neutro Abs 12.6 (H) 1.7 - 7.7 K/uL   Lymphocytes Relative 12 %   Lymphs Abs 1.8 0.7 - 4.0 K/uL   Monocytes Relative 6 %   Monocytes Absolute 0.9 0.1 - 1.0 K/uL   Eosinophils Relative 0 %   Eosinophils Absolute 0.1 0.0 - 0.5 K/uL   Basophils Relative 0 %   Basophils Absolute 0.0 0.0 - 0.1 K/uL   Immature Granulocytes 1 %   Abs Immature Granulocytes 0.19 (H) 0.00 - 0.07 K/uL    Comment: Performed at [facility], [place] [address], [place], [place] [address]  CK     Status: None   Collection Time: 06/04/22  2:05 PM  Result Value Ref Range   Total CK 103 38 - 234 U/L    Comment: Performed at [facility], [place] [address], [place], [place] [address]    VAS [place] LOWER EXTREMITY VENOUS (DVT)  Result Date: 06/04/2022  Lower Venous DVT Study Patient Name:  Connie Castaneda  Date of Exam:   06/04/2022 Medical Rec #: [id number]    Accession #:    [id number] Date of Birth: [birth year]    Patient Gender:  F Patient Age:   2 years Exam Location:  [facility] Procedure:      VAS [place] LOWER EXTREMITY VENOUS (DVT) Referring Phys: Davonna Belling --------------------------------------------------------------------------------  Indications: Pain, and Swelling.  Limitations: Poor ultrasound/tissue interface and pain intolerance. Comparison Study: Previous exam on 05/27/22 was negative for DVT Performing Technologist: Rogelia Rohrer RVT, RDMS  Examination Guidelines: A complete evaluation includes B-mode imaging, spectral Doppler, color Doppler, and power Doppler as needed of all accessible portions of each vessel. Bilateral testing is considered an integral part of a complete examination. Limited examinations for reoccurring indications may be performed as noted. The reflux portion of the exam is performed with the patient in reverse Trendelenburg.  +-----+---------------+---------+-----------+----------+--------------+ RIGHTCompressibilityPhasicitySpontaneityPropertiesThrombus Aging +-----+---------------+---------+-----------+----------+--------------+ CFV  Full           Yes      Yes                                 +-----+---------------+---------+-----------+----------+--------------+   +---------+---------------+---------+-----------+----------+-------------------+ LEFT     CompressibilityPhasicitySpontaneityPropertiesThrombus Aging      +---------+---------------+---------+-----------+----------+-------------------+ CFV      Full           Yes      Yes                                      +---------+---------------+---------+-----------+----------+-------------------+ SFJ      Full                                                             +---------+---------------+---------+-----------+----------+-------------------+ FV Prox  Full           Yes      Yes                                      +---------+---------------+---------+-----------+----------+-------------------+  FV Mid   Full           Yes      Yes                                      +---------+---------------+---------+-----------+----------+-------------------+ FV DistalFull           Yes      Yes                                      +---------+---------------+---------+-----------+----------+-------------------+ PFV      Full                                                             +---------+---------------+---------+-----------+----------+-------------------+ POP      Full           Yes      Yes                                      +---------+---------------+---------+-----------+----------+-------------------+ PTV                                                   Not well visualized +---------+---------------+---------+-----------+----------+-------------------+ PERO                                                  not visualized      +---------+---------------+---------+-----------+----------+-------------------+  Left Technical Findings: Not visualized segments include peroneal veins.   Summary: RIGHT: - No evidence of common femoral vein obstruction.  LEFT: - There is no evidence of deep vein thrombosis in the lower extremity. However, portions of this examination were limited- see technologist comments above.  - No cystic structure found in the popliteal fossa.  *See table(s) above for measurements and observations. Electronically signed by Monica Martinez MD on 06/04/2022 at 5:18:03 PM.    Final    CT EXTREMITY LOWER LEFT W CONTRAST  Result Date: 06/04/2022 CLINICAL DATA:  Left leg pain. EXAM: CT OF THE LOWER LEFT EXTREMITY WITH CONTRAST TECHNIQUE: Multidetector CT imaging of the lower left extremity was performed according to the standard protocol following intravenous contrast administration. RADIATION DOSE REDUCTION: This exam was performed according to the departmental dose-optimization program which includes automated exposure control, adjustment of the mA  and/or kV according to patient size and/or use of iterative reconstruction technique. CONTRAST:  126m OMNIPAQUE IOHEXOL 300 MG/ML  SOLN COMPARISON:  Left tibia and fibula x-rays from same day. Left knee x-rays dated May 28, 2022. FINDINGS: Bones/Joint/Cartilage No fracture or dislocation. Joint spaces are preserved. Moderate knee joint effusion. Ligaments Ligaments are suboptimally evaluated by CT. Muscles and Tendons Large multiloculated rim enhancing fluid collection involving the medial and lateral gastrocnemius muscles, measuring approximately 5.1 x 9.6 x 17.1 cm. Soft tissue Scattered superficial soft  tissue swelling. No subcutaneous emphysema. No soft tissue mass. IMPRESSION: 1. Large multiloculated rim enhancing fluid collection involving the medial and lateral gastrocnemius muscles, concerning for intramuscular abscess. Myonecrosis is less likely given the absence of diabetes and normal CK level. 2. Moderate knee joint effusion, nonspecific. Consider arthrocentesis. Electronically Signed   By: Titus Dubin M.D.   On: 06/04/2022 17:17   DG Tibia/Fibula Left  Result Date: 06/04/2022 CLINICAL DATA:  Swelling without injury EXAM: LEFT TIBIA AND FIBULA - 2 VIEW COMPARISON:  None Available. FINDINGS: An anchor in the posterior calcaneus is likely from Achilles repair. Soft tissue swelling identified. No foreign bodies. No soft tissue gas. No fracture or dislocation. No bony erosion. IMPRESSION: Soft tissue swelling. No other acute abnormalities. Electronically Signed   By: Dorise Bullion III M.D.   On: 06/04/2022 14:02    ROS:  + for calf pain and swelling.  No f/c/n/v.  No wt loss or change in appetite.  10 system review o/w neg. PE:  Blood pressure 136/77, pulse (!) 133, temperature 98.6 F (37 C), temperature source Oral, resp. rate 19, height '5\' 6"'$  (1.676 m), last menstrual period 07/03/2017, SpO2 100 %. Morbidly obese woman in nad.  A and O.  EOMI.  Resp unlabored.  L calf swollen and  extremely ttp.  Pain with motion at the ankle.  No lymphadenopathy or lymphangiitis.  Pulses are palpable in the foot.  CT shows a large abscess in the proximal calf including the medial and lateral gastroc  Assessment/Plan: Left calf abscess - to OR this evening for I and D, cultures and arthrocentesis of the knee.  NPO.  Hold blood thinners.  Hospitalist admission.  Hold abx for cultures.    Wylene Simmer 06/04/2022, 6:22 PM

## 2022-06-04 NOTE — ED Notes (Signed)
Shift change, please allow time for RN to get reports

## 2022-06-04 NOTE — Anesthesia Procedure Notes (Signed)
Procedure Name: Intubation Date/Time: 06/04/2022 8:24 PM  Performed by: Lavina Hamman, CRNAPre-anesthesia Checklist: Patient identified, Emergency Drugs available, Suction available, Patient being monitored and Timeout performed Patient Re-evaluated:Patient Re-evaluated prior to induction Oxygen Delivery Method: Circle system utilized Preoxygenation: Pre-oxygenation with 100% oxygen Induction Type: IV induction Ventilation: Mask ventilation without difficulty Laryngoscope Size: Mac and 3 Grade View: Grade II Tube type: Oral Tube size: 7.0 mm Number of attempts: 1 Airway Equipment and Method: Stylet Placement Confirmation: ETT inserted through vocal cords under direct vision, positive ETCO2, CO2 detector and breath sounds checked- equal and bilateral Secured at: 21 cm Tube secured with: Tape Dental Injury: Teeth and Oropharynx as per pre-operative assessment  Comments: ATOI

## 2022-06-05 ENCOUNTER — Encounter (HOSPITAL_COMMUNITY): Payer: Self-pay | Admitting: Orthopedic Surgery

## 2022-06-05 DIAGNOSIS — L02416 Cutaneous abscess of left lower limb: Secondary | ICD-10-CM | POA: Diagnosis not present

## 2022-06-05 LAB — BLOOD CULTURE ID PANEL (REFLEXED) - BCID2
A.calcoaceticus-baumannii: NOT DETECTED
A.calcoaceticus-baumannii: NOT DETECTED
Bacteroides fragilis: NOT DETECTED
Bacteroides fragilis: NOT DETECTED
Candida albicans: NOT DETECTED
Candida albicans: NOT DETECTED
Candida auris: NOT DETECTED
Candida auris: NOT DETECTED
Candida glabrata: NOT DETECTED
Candida glabrata: NOT DETECTED
Candida krusei: NOT DETECTED
Candida krusei: NOT DETECTED
Candida parapsilosis: NOT DETECTED
Candida parapsilosis: NOT DETECTED
Candida tropicalis: NOT DETECTED
Candida tropicalis: NOT DETECTED
Cryptococcus neoformans/gattii: NOT DETECTED
Cryptococcus neoformans/gattii: NOT DETECTED
Enterobacter cloacae complex: NOT DETECTED
Enterobacter cloacae complex: NOT DETECTED
Enterobacterales: NOT DETECTED
Enterobacterales: NOT DETECTED
Enterococcus Faecium: NOT DETECTED
Enterococcus Faecium: NOT DETECTED
Enterococcus faecalis: NOT DETECTED
Enterococcus faecalis: NOT DETECTED
Escherichia coli: NOT DETECTED
Escherichia coli: NOT DETECTED
Haemophilus influenzae: NOT DETECTED
Haemophilus influenzae: NOT DETECTED
Klebsiella aerogenes: NOT DETECTED
Klebsiella aerogenes: NOT DETECTED
Klebsiella oxytoca: NOT DETECTED
Klebsiella oxytoca: NOT DETECTED
Klebsiella pneumoniae: NOT DETECTED
Klebsiella pneumoniae: NOT DETECTED
Listeria monocytogenes: NOT DETECTED
Listeria monocytogenes: NOT DETECTED
Neisseria meningitidis: NOT DETECTED
Neisseria meningitidis: NOT DETECTED
Proteus species: NOT DETECTED
Proteus species: NOT DETECTED
Pseudomonas aeruginosa: NOT DETECTED
Pseudomonas aeruginosa: NOT DETECTED
Salmonella species: NOT DETECTED
Salmonella species: NOT DETECTED
Serratia marcescens: NOT DETECTED
Serratia marcescens: NOT DETECTED
Staphylococcus aureus (BCID): NOT DETECTED
Staphylococcus aureus (BCID): NOT DETECTED
Staphylococcus epidermidis: NOT DETECTED
Staphylococcus epidermidis: NOT DETECTED
Staphylococcus lugdunensis: NOT DETECTED
Staphylococcus lugdunensis: NOT DETECTED
Staphylococcus species: NOT DETECTED
Staphylococcus species: DETECTED — AB
Stenotrophomonas maltophilia: NOT DETECTED
Stenotrophomonas maltophilia: NOT DETECTED
Streptococcus agalactiae: NOT DETECTED
Streptococcus agalactiae: NOT DETECTED
Streptococcus pneumoniae: NOT DETECTED
Streptococcus pneumoniae: NOT DETECTED
Streptococcus pyogenes: NOT DETECTED
Streptococcus pyogenes: NOT DETECTED
Streptococcus species: DETECTED — AB
Streptococcus species: NOT DETECTED

## 2022-06-05 LAB — CBC
HCT: 36.6 % (ref 36.0–46.0)
Hemoglobin: 11.6 g/dL — ABNORMAL LOW (ref 12.0–15.0)
MCH: 28.5 pg (ref 26.0–34.0)
MCHC: 31.7 g/dL (ref 30.0–36.0)
MCV: 89.9 fL (ref 80.0–100.0)
Platelets: 335 10*3/uL (ref 150–400)
RBC: 4.07 MIL/uL (ref 3.87–5.11)
RDW: 13.3 % (ref 11.5–15.5)
WBC: 19.1 10*3/uL — ABNORMAL HIGH (ref 4.0–10.5)
nRBC: 0 % (ref 0.0–0.2)

## 2022-06-05 LAB — SYNOVIAL CELL COUNT + DIFF, W/ CRYSTALS
Crystals, Fluid: NONE SEEN
Eosinophils-Synovial: 1 % (ref 0–1)
Lymphocytes-Synovial Fld: 20 % (ref 0–20)
Monocyte-Macrophage-Synovial Fluid: 13 % — ABNORMAL LOW (ref 50–90)
Neutrophil, Synovial: 66 % — ABNORMAL HIGH (ref 0–25)
WBC, Synovial: 4800 /mm3 — ABNORMAL HIGH (ref 0–200)

## 2022-06-05 LAB — GLUCOSE, CAPILLARY
Glucose-Capillary: 150 mg/dL — ABNORMAL HIGH (ref 70–99)
Glucose-Capillary: 182 mg/dL — ABNORMAL HIGH (ref 70–99)

## 2022-06-05 LAB — COMPREHENSIVE METABOLIC PANEL
ALT: 15 U/L (ref 0–44)
AST: 27 U/L (ref 15–41)
Albumin: 2.4 g/dL — ABNORMAL LOW (ref 3.5–5.0)
Alkaline Phosphatase: 83 U/L (ref 38–126)
Anion gap: 9 (ref 5–15)
BUN: 12 mg/dL (ref 6–20)
CO2: 28 mmol/L (ref 22–32)
Calcium: 8.2 mg/dL — ABNORMAL LOW (ref 8.9–10.3)
Chloride: 95 mmol/L — ABNORMAL LOW (ref 98–111)
Creatinine, Ser: 0.76 mg/dL (ref 0.44–1.00)
GFR, Estimated: >60 mL/min (ref >60)
Glucose, Bld: 221 mg/dL — ABNORMAL HIGH (ref 70–99)
Potassium: 4.4 mmol/L (ref 3.5–5.1)
Sodium: 132 mmol/L — ABNORMAL LOW (ref 135–145)
Total Bilirubin: 0.7 mg/dL (ref 0.3–1.2)
Total Protein: 7.7 g/dL (ref 6.5–8.1)

## 2022-06-05 LAB — URINALYSIS, W/ REFLEX TO CULTURE (INFECTION SUSPECTED)
Bilirubin Urine: NEGATIVE
Glucose, UA: NEGATIVE mg/dL
Hgb urine dipstick: NEGATIVE
Ketones, ur: 5 mg/dL — AB
Leukocytes,Ua: NEGATIVE
Nitrite: POSITIVE — AB
Protein, ur: NEGATIVE mg/dL
Specific Gravity, Urine: >1.046 — ABNORMAL HIGH (ref 1.005–1.030)
pH: 5 (ref 5.0–8.0)

## 2022-06-05 LAB — HIV ANTIBODY (ROUTINE TESTING W REFLEX): HIV Screen 4th Generation wRfx: NONREACTIVE

## 2022-06-05 MED ORDER — INSULIN ASPART 100 UNIT/ML IJ SOLN: 0.0000 [IU] | Freq: Every day | INTRAMUSCULAR | Status: DC

## 2022-06-05 MED ORDER — INSULIN ASPART 100 UNIT/ML IJ SOLN: 0.0000 [IU] | Freq: Three times a day (TID) | INTRAMUSCULAR | Status: DC

## 2022-06-05 MED ORDER — MELOXICAM 15 MG PO TABS: 7.5000 mg | ORAL_TABLET | Freq: Every day | ORAL | Status: DC | PRN

## 2022-06-05 NOTE — Progress Notes (Signed)
[facility] PROGRESS NOTE  Sima Gennette (DOB: 1970-06-28) [id number] PCP: Lin Landsman, MD  Brief Narrative: Connie Castaneda is a 52 y.o. female with a history of HTN, GERD, obesity (BMI 3) who presented to the ED by EMS on 06/04/2022 due to 2 weeks of worsening left calf/posterior knee pain, swelling, redness. She was found to be febrile to 100.61F with leukocytosis (WBC 15.5k) with normal lactic acid and CK levels. Imaging revealed a large multiloculated rim enhancing fluid collection involving the medial and lateral gastrocnemius muscles, concerning for intramuscular abscess and a moderate knee joint effusion. Operative I&D and arthrocentesis performed on 3/2. ID is consulted and broad IV antibiotics started.   Subjective: Soreness diffusely mostly on left medial and posterior calf worse with palpation or movement.   Objective: BP 132/71 (BP Location: Left Arm)   Pulse 78   Temp (!) 97.5 F (36.4 C) (Oral)   Resp 16   Ht '5\' 6"'$  (1.676 m)   Wt 112.2 kg   LMP 07/03/2017   SpO2 100%   BMI 39.92 kg/m   Gen: No distress Pulm: Clear, nonlabored  CV: RRR, no MRG GI: Soft, NT, ND, +BS  Neuro: Alert and oriented. No new focal deficits. Ext: Warm, dry, sensation and motor function intact throughout inclusive of distal LLE. Knee not tested.  Skin: ACE wrap postsurgical dressing in place is c/d/I. JP drain with minimal sanguinous output. No other rashes, lesions or ulcers on visualized skin   Blood cultures 06/04/22:  - Collection 1: Rapid ID strep spp. Anaerobic bottle: GPC in chains Aerobic bottle: GPC in clusters - Collection 2: NGTD  HIV: NR  Venous U/S negative for DVT.  WBC up to 19.1k.   Assessment & Plan: Sepsis due to left lower extremity abscess s/p I&D of left calf and posterior knee, left knee arthrocentesis 3/2 by Dr. Doran Durand. - Continue vancomycin, cefepime pending culture data (GPC in clusters on gram stain of abscess, none from arthrocentesis), blood cultures with  GPC in chains and clusters (see above) and ID input.  - Drain management and wound care per orthopedics.   Bacteremia: GPC in chains (1 of 4 bottles) and clusters (different 1 of 4 bottles) growing thus far.  - Covered by current antimicrobials.  - Check echo, repeat cultures in AM.  HTN:  - Continue home metoprolol  Obesity: Body mass index is 39.92 kg/m.  - With abscess and hyperglycemia, check HbA1c, cover with SSI  Hyponatremia: Mild, monitoring daily  Hypoalbuminemia:  - RD consult  Gout:  - Continue colchicine  GERD:  - Continue home pepcid, PPI  Patrecia Pour, MD [facility] [email or web address] 06/05/2022, 12:59 PM

## 2022-06-05 NOTE — TOC Initial Note (Signed)
Transition of Care [facility]) - Initial/Assessment Note    Patient Details  Name: Connie Castaneda MRN: [id number] Date of Birth: [birth year]  [facility]) CM/SW Contact:    Rodney Booze, LCSW Phone Number: 06/05/2022, 10:34 AM  Clinical Narrative:                 CSW met patient at bedside at this time the patient states she has no TOC needs. CSW made Charge nurse aware any changes please put in new consult. TOC will sign off at this time.        Patient Goals and CMS Choice            Expected Discharge Plan and Services                                              Prior Living Arrangements/Services                       Activities of Daily Living Home Assistive Devices/Equipment: None ADL Screening (condition at time of admission) Patient's cognitive ability adequate to safely complete daily activities?: Yes Is the patient deaf or have difficulty hearing?: No Does the patient have difficulty seeing, even when wearing glasses/contacts?: No Does the patient have difficulty concentrating, remembering, or making decisions?: No Patient able to express need for assistance with ADLs?: Yes Does the patient have difficulty dressing or bathing?: No Independently performs ADLs?: Yes (appropriate for developmental age) Does the patient have difficulty walking or climbing stairs?: No Weakness of Legs: Left Weakness of Arms/Hands: None  Permission Sought/Granted                  Emotional Assessment              Admission diagnosis:  Abscess [L02.91] Abscess of left lower extremity [L02.416] Patient Active Problem List   Diagnosis Date Noted   Abscess of left lower extremity 06/04/2022   PCP:  Lin Landsman, MD Pharmacy:   [id number]Phone: [phone]: [phone]    Social Determinants of Health (SDOH) Social History: SDOH  Screenings   Tobacco Use: Low Risk  (06/04/2022)   SDOH Interventions:     Readmission Risk Interventions     No data to display

## 2022-06-05 NOTE — Progress Notes (Signed)
   Subjective: 1 Day Post-Op Procedure(s) (LRB): IRRIGATION AND DEBRIDEMENT EXTREMITY LEFT CALF ABSCESS, LEFT KNEE ARTHROCENTESIS (Left)  Pt c/o mild soreness to the left leg this morning Denies any new symptoms or issues overnight White count continue to increase Patient reports pain as mild.  Objective:   VITALS:   Vitals:   06/05/22 0024 06/05/22 0410  BP: 129/68 112/67  Pulse: (!) 101 77  Resp: 16 16  Temp: 97.7 F (36.5 C) 98.7 F (37.1 C)  SpO2: 94% 99%    Left lower leg: dressing and drain intact Nv intact distally No edema Log roll movement of the left leg with minimal guarding  LABS Recent Labs    06/04/22 1405 06/04/22 2159 06/05/22 0258  HGB 12.4 12.1 11.6*  HCT 40.8 38.5 36.6  WBC 15.5* 17.2* 19.1*  PLT 341 371 335    Recent Labs    06/04/22 1405 06/04/22 2159 06/05/22 0258  NA 133*  --  132*  K 3.9  --  4.4  BUN 10  --  12  CREATININE 0.78 0.57 0.76  GLUCOSE 116*  --  221*     Assessment/Plan: 1 Day Post-Op Procedure(s) (LRB): IRRIGATION AND DEBRIDEMENT EXTREMITY LEFT CALF ABSCESS, LEFT KNEE ARTHROCENTESIS (Left) Will continue to monitor her labs and her progress Cultures show gram positive cocci in clusters Continue IV antibiotics and pain management Will await infectious disease recommendations   Brad Luna [place], MPAS [place] [facility] now [employer]  [facility] [address], [place], [place], [place] [address] Phone: [phone] [email or web address] Facebook  [employer]

## 2022-06-05 NOTE — Consult Note (Signed)
Connie Castaneda for Infectious Disease       Reason for Consult: abscess    Referring Physician: Dr. Bonner Puna  Principal Problem:   Abscess of left lower extremity    colchicine  0.6 mg Oral Daily   docusate sodium  100 mg Oral BID   enoxaparin (LOVENOX) injection  40 mg Subcutaneous Q24H   insulin aspart  0-5 Units Subcutaneous QHS   insulin aspart  0-9 Units Subcutaneous TID WC   metoprolol tartrate  50 mg Oral Daily   pantoprazole  40 mg Oral Daily   senna  1 tablet Oral BID    Recommendations: Continue current antibiotics Monitor cultures  Assessment: She has an abscess of the left calf and a joint effusion though the joint does not appear infected with minimal WBCs and no growth on culture.  Will continue to monitor cultures and determine treatment plan.     Dr. Baxter Flattery will follow up tomorrow  Antibiotics: Currently on vancomycin and cefepime  HPI: Connie Castaneda is a 52 y.o. female with hypertension, obesity developed 2 weeks of calf pain, swelling and redness and now s/p debridement for abscess of calf and knee aspiration done by Dr. Doran Durand.  Purulent fluid noted in the OR with 150 cc of pus extracted.  Now with positive gram stain with GPC in clusters and BCID with Strep species on BCID.  She feels better.  Knee aspiration with only 4,800 WBCs and no growth on culture.    Review of Systems:  Constitutional: negative for fevers and chills All other systems reviewed and are negative    Past Medical History:  Diagnosis Date   GERD (gastroesophageal reflux disease)    Hypertension     Social History   Tobacco Use   Smoking status: Never   Smokeless tobacco: Never  Vaping Use   Vaping Use: Never used  Substance Use Topics   Alcohol use: No   Drug use: No    History reviewed. No pertinent family history.  No Known Allergies  Physical Exam: Constitutional: in no apparent distress  Vitals:   06/05/22 0914 06/05/22 1437  BP: 132/71 119/63  Pulse: 78 68   Resp: 16 15  Temp: (!) 97.5 F (36.4 C) 97.7 F (36.5 C)  SpO2: 100% 100%   EYES: anicteric Respiratory: normal respiratory effort Musculoskeletal: leg wrapped  Lab Results  Component Value Date   WBC 19.1 (H) 06/05/2022   HGB 11.6 (L) 06/05/2022   HCT 36.6 06/05/2022   MCV 89.9 06/05/2022   PLT 335 06/05/2022    Lab Results  Component Value Date   CREATININE 0.76 06/05/2022   BUN 12 06/05/2022   NA 132 (L) 06/05/2022   K 4.4 06/05/2022   CL 95 (L) 06/05/2022   CO2 28 06/05/2022    Lab Results  Component Value Date   ALT 15 06/05/2022   AST 27 06/05/2022   ALKPHOS 83 06/05/2022     Microbiology: Recent Results (from the past 240 hour(s))  Culture, blood (routine x 2)     Status: None (Preliminary result)   Collection Time: 06/04/22  2:05 PM   Specimen: BLOOD  Result Value Ref Range Status   Specimen Description   Final    BLOOD RIGHT ANTECUBITAL Performed at [facility], [place] [address] [place], [place] [address]    Special Requests   Final    BOTTLES DRAWN AEROBIC AND ANAEROBIC Blood Culture adequate volume Performed at [facility]  [address], [place], [place] [address]    Culture   Final    NO GROWTH < 24 HOURS Performed at [facility] [address] [place], [place] [address]    Report Status PENDING  Incomplete  Culture, blood (routine x 2)     Status: None (Preliminary result)   Collection Time: 06/04/22  2:19 PM   Specimen: BLOOD  Result Value Ref Range Status   Specimen Description   Final    BLOOD BLOOD RIGHT HAND Performed at [facility] [address] [place], [place] [address]    Special Requests   Final    BOTTLES DRAWN AEROBIC AND ANAEROBIC Blood Culture adequate volume Performed at [facility] [address] [place], [place] [address]    Culture  Setup Time   Final    ANAEROBIC BOTTLE ONLY GRAM POSITIVE COCCI IN CHAINS Organism ID to  follow AEROBIC BOTTLE ONLY GRAM POSITIVE COCCI IN CLUSTERS CRITICAL RESULT CALLED TO, READ BACK BY AND VERIFIED WITH:  C/ PHARMD M. JAMES 06/05/22 1225 A. LAFRANCE  C/ Naylor 06/05/22 1256 A. LAFRANCE Performed at [facility], [place] [address] [place], [place] [address]    Culture GRAM POSITIVE COCCI  Final   Report Status PENDING  Incomplete  Blood Culture ID Panel (Reflexed)     Status: Abnormal   Collection Time: 06/04/22  2:19 PM  Result Value Ref Range Status   Enterococcus faecalis NOT DETECTED NOT DETECTED Final   Enterococcus Faecium NOT DETECTED NOT DETECTED Final   Listeria monocytogenes NOT DETECTED NOT DETECTED Final   Staphylococcus species NOT DETECTED NOT DETECTED Final   Staphylococcus aureus (BCID) NOT DETECTED NOT DETECTED Final   Staphylococcus epidermidis NOT DETECTED NOT DETECTED Final   Staphylococcus lugdunensis NOT DETECTED NOT DETECTED Final   Streptococcus species DETECTED (A) NOT DETECTED Final    Comment: Not Enterococcus species, Streptococcus agalactiae, Streptococcus pyogenes, or Streptococcus pneumoniae. CRITICAL RESULT CALLED TO, READ BACK BY AND VERIFIED WITH:  C/ PHARMD M. JAMES 06/05/22 1225 A. LAFRANCE    Streptococcus agalactiae NOT DETECTED NOT DETECTED Final   Streptococcus pneumoniae NOT DETECTED NOT DETECTED Final   Streptococcus pyogenes NOT DETECTED NOT DETECTED Final   A.calcoaceticus-baumannii NOT DETECTED NOT DETECTED Final   Bacteroides fragilis NOT DETECTED NOT DETECTED Final   Enterobacterales NOT DETECTED NOT DETECTED Final   Enterobacter cloacae complex NOT DETECTED NOT DETECTED Final   Escherichia coli NOT DETECTED NOT DETECTED Final   Klebsiella aerogenes NOT DETECTED NOT DETECTED Final   Klebsiella oxytoca NOT DETECTED NOT DETECTED Final   Klebsiella pneumoniae NOT DETECTED NOT DETECTED Final   Proteus species NOT DETECTED NOT DETECTED Final   Salmonella species NOT DETECTED NOT DETECTED Final    Serratia marcescens NOT DETECTED NOT DETECTED Final   Haemophilus influenzae NOT DETECTED NOT DETECTED Final   Neisseria meningitidis NOT DETECTED NOT DETECTED Final   Pseudomonas aeruginosa NOT DETECTED NOT DETECTED Final   Stenotrophomonas maltophilia NOT DETECTED NOT DETECTED Final   Candida albicans NOT DETECTED NOT DETECTED Final   Candida auris NOT DETECTED NOT DETECTED Final   Candida glabrata NOT DETECTED NOT DETECTED Final   Candida krusei NOT DETECTED NOT DETECTED Final   Candida parapsilosis NOT DETECTED NOT DETECTED Final   Candida tropicalis NOT DETECTED NOT DETECTED Final   Cryptococcus neoformans/gattii NOT DETECTED NOT DETECTED Final    Comment: Performed at [facility], [address] [place], [place] [address]  Aerobic/Anaerobic Culture w Gram Stain (surgical/deep wound)     Status: None (Preliminary result)   Collection Time: 06/04/22  9:48 PM   Specimen: Abscess  Result Value Ref Range Status   Specimen Description ABSCESS LEFT CALF  Final   Special Requests ID A  Final   Gram Stain   Final    ABUNDANT WBC PRESENT,BOTH PMN AND MONONUCLEAR FEW GRAM POSITIVE COCCI IN CLUSTERS Performed at [facility], [address] [place], [place] [address]    Culture PENDING  Incomplete   Report Status PENDING  Incomplete  Aerobic/Anaerobic Culture w Gram Stain (surgical/deep wound)     Status: None (Preliminary result)   Collection Time: 06/04/22  9:48 PM   Specimen: Synovial, Left Knee; Body Fluid  Result Value Ref Range Status   Specimen Description SYNOVIAL KNEE LEFT FLUID  Final   Special Requests IN STERILE CUP ID B  Final   Gram Stain   Final    FEW WBC PRESENT,BOTH PMN AND MONONUCLEAR NO ORGANISMS SEEN Performed at [facility], [address], [place], [place] [address]    Culture PENDING  Incomplete   Report Status PENDING  Incomplete  Blood Culture ID Panel (Reflexed)     Status: Abnormal   Collection Time: 06/05/22 12:50 PM  Result Value  Ref Range Status   Enterococcus faecalis NOT DETECTED NOT DETECTED Final   Enterococcus Faecium NOT DETECTED NOT DETECTED Final   Listeria monocytogenes NOT DETECTED NOT DETECTED Final   Staphylococcus species DETECTED (A) NOT DETECTED Final    Comment: CRITICAL RESULT CALLED TO, READ BACK BY AND VERIFIED WITH:  C/ PHARMD N. GLOGOVAC FOR AERB 06/05/22 1256 A. LAFRANCE     Staphylococcus aureus (BCID) NOT DETECTED NOT DETECTED Final   Staphylococcus epidermidis NOT DETECTED NOT DETECTED Final   Staphylococcus lugdunensis NOT DETECTED NOT DETECTED Final   Streptococcus species NOT DETECTED NOT DETECTED Final   Streptococcus agalactiae NOT DETECTED NOT DETECTED Final   Streptococcus pneumoniae NOT DETECTED NOT DETECTED Final   Streptococcus pyogenes NOT DETECTED NOT DETECTED Final   A.calcoaceticus-baumannii NOT DETECTED NOT DETECTED Final   Bacteroides fragilis NOT DETECTED NOT DETECTED Final   Enterobacterales NOT DETECTED NOT DETECTED Final   Enterobacter cloacae complex NOT DETECTED NOT DETECTED Final   Escherichia coli NOT DETECTED NOT DETECTED Final   Klebsiella aerogenes NOT DETECTED NOT DETECTED Final   Klebsiella oxytoca NOT DETECTED NOT DETECTED Final   Klebsiella pneumoniae NOT DETECTED NOT DETECTED Final   Proteus species NOT DETECTED NOT DETECTED Final   Salmonella species NOT DETECTED NOT DETECTED Final   Serratia marcescens NOT DETECTED NOT DETECTED Final   Haemophilus influenzae NOT DETECTED NOT DETECTED Final   Neisseria meningitidis NOT DETECTED NOT DETECTED Final   Pseudomonas aeruginosa NOT DETECTED NOT DETECTED Final   Stenotrophomonas maltophilia NOT DETECTED NOT DETECTED Final   Candida albicans NOT DETECTED NOT DETECTED Final   Candida auris NOT DETECTED NOT DETECTED Final   Candida glabrata NOT DETECTED NOT DETECTED Final   Candida krusei NOT DETECTED NOT DETECTED Final   Candida parapsilosis NOT DETECTED NOT DETECTED Final   Candida tropicalis NOT DETECTED  NOT DETECTED Final   Cryptococcus neoformans/gattii NOT DETECTED NOT DETECTED Final    Comment: Performed at [facility], [address], [place], [place] [address]    Jamal Pavon W Cheyane Ayon, [place] for Infectious Disease [facility] [email or web address] 06/05/2022, 3:14 PM

## 2022-06-05 NOTE — Plan of Care (Signed)
  Problem: Education: Goal: Ability to describe self-care measures that may prevent or decrease complications (Diabetes Survival Skills Education) will improve Outcome: Progressing   Problem: Nutritional: Goal: Maintenance of adequate nutrition will improve Outcome: Progressing   Problem: Education: Goal: Individualized Educational Video(s) Outcome: Progressing

## 2022-06-05 NOTE — Anesthesia Postprocedure Evaluation (Signed)
Anesthesia Post Note  Patient: Connie Castaneda  Procedure(s) Performed: IRRIGATION AND DEBRIDEMENT EXTREMITY LEFT CALF ABSCESS, LEFT KNEE ARTHROCENTESIS (Left)     Patient location during evaluation: PACU Anesthesia Type: General Level of consciousness: awake and alert Pain management: pain level controlled Vital Signs Assessment: post-procedure vital signs reviewed and stable Respiratory status: spontaneous breathing, nonlabored ventilation, respiratory function stable and patient connected to nasal cannula oxygen Cardiovascular status: blood pressure returned to baseline and stable Postop Assessment: no apparent nausea or vomiting Anesthetic complications: no   No notable events documented.  Last Vitals:  Vitals:   06/05/22 0914 06/05/22 1437  BP: 132/71 119/63  Pulse: 78 68  Resp: 16 15  Temp: (!) 36.4 C 36.5 C  SpO2: 100% 100%    Last Pain:  Vitals:   06/05/22 1437  TempSrc: Oral  PainSc:                  March Rummage [name]

## 2022-06-05 NOTE — Progress Notes (Signed)
PHARMACY - PHYSICIAN COMMUNICATION CRITICAL VALUE ALERT - BLOOD CULTURE IDENTIFICATION (BCID)  Connie Castaneda is an 51 y.o. female who presented to [facility] on 06/04/2022 with a chief complaint of  Chief Complaint  Patient presents with   Leg Pain   painful urination     Assessment: Soreness diffusely mostly on left medial and posterior calf worse with palpation or movement.    Name of physician (or Provider) Contacted: Dr. Bonner Puna  Current antibiotics:  - Vancomycin and cefepime  Changes to prescribed antibiotics recommended:  - Continue current abx  - ID has been consulted as well   Results for orders placed or performed during the hospital encounter of 06/04/22  Blood Culture ID Panel (Reflexed) (Collected: 06/04/2022  2:19 PM)  Result Value Ref Range   Enterococcus faecalis NOT DETECTED NOT DETECTED   Enterococcus Faecium NOT DETECTED NOT DETECTED   Listeria monocytogenes NOT DETECTED NOT DETECTED   Staphylococcus species NOT DETECTED NOT DETECTED   Staphylococcus aureus (BCID) NOT DETECTED NOT DETECTED   Staphylococcus epidermidis NOT DETECTED NOT DETECTED   Staphylococcus lugdunensis NOT DETECTED NOT DETECTED   Streptococcus species DETECTED (A) NOT DETECTED   Streptococcus agalactiae NOT DETECTED NOT DETECTED   Streptococcus pneumoniae NOT DETECTED NOT DETECTED   Streptococcus pyogenes NOT DETECTED NOT DETECTED   A.calcoaceticus-baumannii NOT DETECTED NOT DETECTED   Bacteroides fragilis NOT DETECTED NOT DETECTED   Enterobacterales NOT DETECTED NOT DETECTED   Enterobacter cloacae complex NOT DETECTED NOT DETECTED   Escherichia coli NOT DETECTED NOT DETECTED   Klebsiella aerogenes NOT DETECTED NOT DETECTED   Klebsiella oxytoca NOT DETECTED NOT DETECTED   Klebsiella pneumoniae NOT DETECTED NOT DETECTED   Proteus species NOT DETECTED NOT DETECTED   Salmonella species NOT DETECTED NOT DETECTED   Serratia marcescens NOT DETECTED NOT DETECTED   Haemophilus influenzae NOT  DETECTED NOT DETECTED   Neisseria meningitidis NOT DETECTED NOT DETECTED   Pseudomonas aeruginosa NOT DETECTED NOT DETECTED   Stenotrophomonas maltophilia NOT DETECTED NOT DETECTED   Candida albicans NOT DETECTED NOT DETECTED   Candida auris NOT DETECTED NOT DETECTED   Candida glabrata NOT DETECTED NOT DETECTED   Candida krusei NOT DETECTED NOT DETECTED   Candida parapsilosis NOT DETECTED NOT DETECTED   Candida tropicalis NOT DETECTED NOT DETECTED   Cryptococcus neoformans/gattii NOT DETECTED NOT DETECTED   Royetta Asal, PharmD, BCPS 06/05/2022 1:42 PM

## 2022-06-06 ENCOUNTER — Inpatient Hospital Stay (HOSPITAL_COMMUNITY): Payer: Medicaid Other

## 2022-06-06 DIAGNOSIS — R7881 Bacteremia: Secondary | ICD-10-CM | POA: Diagnosis not present

## 2022-06-06 DIAGNOSIS — L02416 Cutaneous abscess of left lower limb: Secondary | ICD-10-CM | POA: Diagnosis not present

## 2022-06-06 LAB — GLUCOSE, CAPILLARY
Glucose-Capillary: 107 mg/dL — ABNORMAL HIGH (ref 70–99)
Glucose-Capillary: 116 mg/dL — ABNORMAL HIGH (ref 70–99)
Glucose-Capillary: 105 mg/dL — ABNORMAL HIGH (ref 70–99)
Glucose-Capillary: 109 mg/dL — ABNORMAL HIGH (ref 70–99)

## 2022-06-06 LAB — ECHOCARDIOGRAM COMPLETE
Area-P 1/2: 2.55 cm2
Calc EF: 57.8 %
Height: 66 in
S' Lateral: 2.9 cm
Single Plane A2C EF: 58 %
Single Plane A4C EF: 56.4 %
Weight: 3957.7 oz

## 2022-06-06 LAB — CBC
HCT: 36.4 % (ref 36.0–46.0)
Hemoglobin: 11.5 g/dL — ABNORMAL LOW (ref 12.0–15.0)
MCH: 28.3 pg (ref 26.0–34.0)
MCHC: 31.6 g/dL (ref 30.0–36.0)
MCV: 89.4 fL (ref 80.0–100.0)
Platelets: 384 10*3/uL (ref 150–400)
RBC: 4.07 MIL/uL (ref 3.87–5.11)
RDW: 13.2 % (ref 11.5–15.5)
WBC: 20.8 10*3/uL — ABNORMAL HIGH (ref 4.0–10.5)
nRBC: 0 % (ref 0.0–0.2)

## 2022-06-06 LAB — BASIC METABOLIC PANEL
Anion gap: 10 (ref 5–15)
BUN: 14 mg/dL (ref 6–20)
CO2: 24 mmol/L (ref 22–32)
Calcium: 8.4 mg/dL — ABNORMAL LOW (ref 8.9–10.3)
Chloride: 102 mmol/L (ref 98–111)
Creatinine, Ser: 0.6 mg/dL (ref 0.44–1.00)
GFR, Estimated: >60 mL/min (ref >60)
Glucose, Bld: 129 mg/dL — ABNORMAL HIGH (ref 70–99)
Potassium: 3.9 mmol/L (ref 3.5–5.1)
Sodium: 136 mmol/L (ref 135–145)

## 2022-06-06 MED ORDER — SODIUM CHLORIDE 0.9 % IV SOLN: INTRAVENOUS | Status: DC | PRN

## 2022-06-06 MED ORDER — DOCUSATE SODIUM 100 MG PO CAPS: 100.0000 mg | ORAL_CAPSULE | Freq: Two times a day (BID) | ORAL | 0 refills | Status: DC

## 2022-06-06 MED ORDER — SENNA 8.6 MG PO TABS: 2.0000 | ORAL_TABLET | Freq: Two times a day (BID) | ORAL | 0 refills | Status: DC

## 2022-06-06 MED ORDER — ORAL CARE MOUTH RINSE: 15.0000 mL | OROMUCOSAL | Status: DC | PRN

## 2022-06-06 MED ORDER — ENSURE MAX PROTEIN PO LIQD
11.0000 [oz_av] | Freq: Two times a day (BID) | ORAL | Status: DC
  Administered 2022-06-06: 11 [oz_av] via ORAL
  Filled 2022-06-06 (×3): qty 330

## 2022-06-06 MED ORDER — OXYCODONE HCL 5 MG PO TABS: 5.0000 mg | ORAL_TABLET | ORAL | 0 refills | Status: AC | PRN

## 2022-06-06 NOTE — Progress Notes (Signed)
Echocardiogram 2D Echocardiogram has been performed.  Frances Furbish 06/06/2022, 1:35 PM

## 2022-06-06 NOTE — Progress Notes (Signed)
Subjective: 2 Days Post-Op Procedure(s) (LRB): IRRIGATION AND DEBRIDEMENT EXTREMITY LEFT CALF ABSCESS, LEFT KNEE ARTHROCENTESIS (Left)  Patient reports pain as mild to moderate.  Tolerating POs well.  Denies fever, N/V, CP, SOB.  Resting comfortably in bed.  RN reports overnight output of JP drain 30cc.  Objective:   VITALS:  Temp:  [97.5 F (36.4 C)-97.9 F (36.6 C)] 97.9 F (36.6 C) (03/04 0622) Pulse Rate:  [65-78] 65 (03/04 0622) Resp:  [15-17] 16 (03/04 0622) BP: (119-139)/(63-84) 139/84 (03/04 0622) SpO2:  [99 %-100 %] 99 % (03/04 0622)  General: WDWN patient in NAD. Psych:  Appropriate mood and affect. Neuro:  A&O x 3, Moving all extremities, sensation intact to light touch HEENT:  EOMs intact Chest:  Even non-labored respirations Skin:  Dressing C/D/I, no rashes or lesions.  JP drain with scant amount of serosanguinous drainage. Extremities: warm/dry, mild edema to L calf, no erythema or echymosis.  No lymphadenopathy. Pulses: Dorsalis pedis 2+ MSK:  ROM: TKE; Ankle ROM: DF to neutral, PF 30 degrees, Inv 30 degrees, Ev 15 degrees, MMT: able to perform quad set.   LABS Recent Labs    06/04/22 1405 06/04/22 2159 06/05/22 0258 06/06/22 0259  HGB 12.4 12.1 11.6* 11.5*  WBC 15.5* 17.2* 19.1* 20.8*  PLT 341 371 335 384   Recent Labs    06/05/22 0258 06/06/22 0259  NA 132* 136  K 4.4 3.9  CL 95* 102  CO2 28 24  BUN 12 14  CREATININE 0.76 0.60  GLUCOSE 221* 129*   No results for input(s): "LABPT", "INR" in the last 72 hours.   Assessment/Plan: 2 Days Post-Op Procedure(s) (LRB): IRRIGATION AND DEBRIDEMENT EXTREMITY LEFT CALF ABSCESS, LEFT KNEE ARTHROCENTESIS (Left)  JP drain pulled.  Sterile dressing and new ACE bandage applied. Reinforce dressing prn Intra cultures show gram positive cocci in clusters. IV ABX per ID. Rx for oxycodone sent to patient's pharmacy.  After searching [place] PMP Aware the patient is provided a Rx for oxycodone. Plan for 2 week  outpatient post-op visit. Patient stable from ortho perspective.  Mechele Claude PA-C [facility] Office:  [phone]

## 2022-06-06 NOTE — Discharge Instructions (Signed)
Connie Simmer, MD [facility]  Please read the following information regarding your care after surgery.  Medications  You only need a prescription for the narcotic pain medicine (ex. oxycodone, Percocet, Norco).  All of the other medicines listed below are available over the counter. ? Aleve 2 pills twice a day for the first 3 days after surgery. ? acetominophen (Tylenol) 650 mg every 4-6 hours as you need for minor to moderate pain ? oxycodone as prescribed for severe pain  Narcotic pain medicine (ex. oxycodone, Percocet, Vicodin) will cause constipation.  To prevent this problem, take the following medicines while you are taking any pain medicine. ? docusate sodium (Colace) 100 mg twice a day ? senna (Senokot) 2 tablets twice a day  Weight Bearing ? Bear weight when you are able on your operated leg or foot.   Cast / Splint / Dressing ? Keep your splint, cast or dressing clean and dry.  Don't put anything (coat hanger, pencil, etc) down inside of it.  If it gets damp, use a hair dryer on the cool setting to dry it.  If it gets soaked, call the office to schedule an appointment for a cast change.    After your dressing, cast or splint is removed; you may shower, but do not soak or scrub the wound.  Allow the water to run over it, and then gently pat it dry.  Swelling It is normal for you to have swelling where you had surgery.  To reduce swelling and pain, keep your toes above your nose for at least 3 days after surgery.  It may be necessary to keep your foot or leg elevated for several weeks.  If it hurts, it should be elevated.  Follow Up Call my office at [phone] when you are discharged from the hospital or surgery center to schedule an appointment to be seen two weeks after surgery.  Call my office at [phone] if you develop a fever >101.5 F, nausea, vomiting, bleeding from the surgical site or severe pain.

## 2022-06-06 NOTE — Progress Notes (Signed)
[facility] PROGRESS NOTE  Feliciana Rolfson (DOB: 06-Aug-1970) [id number] PCP: Lin Landsman, MD  Brief Narrative: Sydell Ontiveros is a 52 y.o. female with a history of HTN, GERD, obesity (BMI 67) who presented to the ED by EMS on 06/04/2022 due to 2 weeks of worsening left calf/posterior knee pain, swelling, redness. She was found to be febrile to 100.60F with leukocytosis (WBC 15.5k) with normal lactic acid and CK levels. Imaging revealed a large multiloculated rim enhancing fluid collection involving the medial and lateral gastrocnemius muscles, concerning for intramuscular abscess and a moderate knee joint effusion. Operative I&D and arthrocentesis performed on 3/2. ID is consulted and broad IV antibiotics continued pending culture data.  Subjective: No new complaints. Pain controlled. Ortho reevaluated wound, rewrapped and pulled drain, only 30cc out. No fevers.  Objective: BP 139/84 (BP Location: Left Arm)   Pulse 65   Temp 97.9 F (36.6 C) (Oral)   Resp 16   Ht '5\' 6"'$  (1.676 m)   Wt 112.2 kg   LMP 07/03/2017   SpO2 99%   BMI 39.92 kg/m   Gen: No distress Pulm: Clear, nonlabored  CV: RRR, no MRG or pitting edema GI: Soft, NT, ND, +BS  Neuro: Alert and oriented. No new focal deficits. Ext: Warm, no deformities Skin: LLE dressing intact. No new rashes, lesions or ulcers on visualized skin   Blood cultures 06/04/22:  - Collection 1: Rapid ID strep spp. Anaerobic bottle: GPC in chains Aerobic bottle: GPC in clusters - Collection 2: NGTD  Synovial culture 3/2: NGTD  Abscess culture 3/2: Staphylococcus aureus HIV: NR  Assessment & Plan: Sepsis due to left lower extremity abscess s/p I&D of left calf and posterior knee, left knee arthrocentesis 3/2 by Dr. Doran Durand. - Continue vancomycin, cefepime pending culture data/susceptibilities.  - Drain pulled, local wound care is per orthopedics.   Bacteremia: GPC in chains (1 of 4 bottles) and clusters (different 1 of 4 bottles) growing  thus far.  - Covered by current antimicrobials.  - Check echo, cultures repeated 3/4.   HTN:  - Continue home metoprolol  Obesity: Body mass index is 39.92 kg/m.  - With abscess and hyperglycemia, check HbA1c (pending) cover with SSI  Hyponatremia: Mild, monitoring daily  Hypoalbuminemia:  - RD consult  Gout:  - Continue colchicine  GERD:  - Continue home pepcid, PPI  Patrecia Pour, MD [facility] [email or web address] 06/06/2022, 1:18 PM

## 2022-06-06 NOTE — Progress Notes (Signed)
[facility] for Infectious Disease    Date of Admission:  06/04/2022   Total days of antibiotics 3   ID: Connie Castaneda is a 52 y.o. female with left leg abscess plus secondary bacteremia Principal Problem:   Abscess of left lower extremity    Subjective: Left leg feeling improved. Underwent I x D and left knee arthrocentesis. Dr hewitt's OP note comment on apporx 150ML of purulence evacuated from deep tissue abscess  Medications:   colchicine  0.6 mg Oral Daily   docusate sodium  100 mg Oral BID   enoxaparin (LOVENOX) injection  40 mg Subcutaneous Q24H   insulin aspart  0-5 Units Subcutaneous QHS   insulin aspart  0-9 Units Subcutaneous TID WC   metoprolol tartrate  50 mg Oral Daily   pantoprazole  40 mg Oral Daily   Ensure Max Protein  11 oz Oral BID   senna  1 tablet Oral BID    Objective: Vital signs in last 24 hours: Temp:  [97.5 F (36.4 C)-97.9 F (36.6 C)] 97.5 F (36.4 C) (03/04 1418) Pulse Rate:  [58-72] 58 (03/04 1418) Resp:  [16-18] 18 (03/04 1418) BP: (107-139)/(56-84) 107/56 (03/04 1418) SpO2:  [99 %-100 %] 100 % (03/04 1418)   Physical Exam  Constitutional:  oriented to person, place, and time. appears well-developed and well-nourished. No distress.  HENT: [place]/AT, PERRLA, no scleral icterus Mouth/Throat: Oropharynx is clear and moist. No oropharyngeal exudate.  Cardiovascular: Normal rate, regular rhythm and normal heart sounds. Exam reveals no gallop and no friction rub.  No murmur heard.  Pulmonary/Chest: Effort normal and breath sounds normal. No respiratory distress.  has no wheezes.  Neck = supple, no nuchal rigidity Abdominal: Soft. Bowel sounds are normal.  exhibits no distension. There is no tenderness.  Ext: left leg wrapped from surgery Skin: Skin is warm and dry. No rash noted. No erythema.  Psychiatric: a normal mood and affect.  behavior is normal.    Lab Results Recent Labs    06/05/22 0258 06/06/22 0259  WBC 19.1* 20.8*  HGB  11.6* 11.5*  HCT 36.6 36.4  NA 132* 136  K 4.4 3.9  CL 95* 102  CO2 28 24  BUN 12 14  CREATININE 0.76 0.60   Liver Panel Recent Labs    06/04/22 1405 06/05/22 0258  PROT 8.6* 7.7  ALBUMIN 2.8* 2.4*  AST 22 27  ALT 13 15  ALKPHOS 92 83  BILITOT 0.7 0.7   Sedimentation Rate No results for input(s): "ESRSEDRATE" in the last 72 hours. C-Reactive Protein No results for input(s): "CRP" in the last 72 hours.  Microbiology: 3/2 left calf abscess staph aureus 3/2 blood cx staph species and strep species Studies/Results: ECHOCARDIOGRAM COMPLETE  Result Date: 06/06/2022    ECHOCARDIOGRAM REPORT   Patient Name:   Connie Castaneda Date of Exam: 06/06/2022 Medical Rec #:  [id number]   Height:       66.0 in Accession #:    [id number]  Weight:       247.4 lb Date of Birth:  1970-11-10   BSA:          2.189 m Patient Age:    14 years    BP:           139/84 mmHg Patient Gender: F           HR:           64 bpm. Exam Location:  Inpatient Procedure: 2D Echo, Cardiac Doppler and  Color Doppler Indications:    bacteremia  History:        Patient has no prior history of Echocardiogram examinations.                 Risk Factors:Hypertension.  Sonographer:    Phineas Douglas Referring Phys: [place]  1. Left ventricular ejection fraction, by estimation, is 55 to 60%. The left ventricle has normal function. The left ventricle has no regional wall motion abnormalities. Left ventricular diastolic parameters were normal.  2. Right ventricular systolic function is normal. The right ventricular size is normal.  3. The mitral valve is normal in structure. Trivial mitral valve regurgitation.  4. The aortic valve is tricuspid. Aortic valve regurgitation is not visualized. FINDINGS  Left Ventricle: Left ventricular ejection fraction, by estimation, is 55 to 60%. The left ventricle has normal function. The left ventricle has no regional wall motion abnormalities. The left ventricular internal cavity size was  normal in size. There is  no left ventricular hypertrophy. Left ventricular diastolic parameters were normal. Right Ventricle: The right ventricular size is normal. Right vetricular wall thickness was not assessed. Right ventricular systolic function is normal. Left Atrium: Left atrial size was normal in size. Right Atrium: Right atrial size was normal in size. Pericardium: There is no evidence of pericardial effusion. Mitral Valve: The mitral valve is normal in structure. Trivial mitral valve regurgitation. Tricuspid Valve: The tricuspid valve is normal in structure. Tricuspid valve regurgitation is trivial. Aortic Valve: The aortic valve is tricuspid. Aortic valve regurgitation is not visualized. Pulmonic Valve: The pulmonic valve was normal in structure. Pulmonic valve regurgitation is not visualized. Aorta: The aortic root and ascending aorta are structurally normal, with no evidence of dilitation. IAS/Shunts: No atrial level shunt detected by color flow Doppler.  LEFT VENTRICLE PLAX 2D LVIDd:         4.70 cm      Diastology LVIDs:         2.90 cm      LV e' medial:    9.25 cm/s LV PW:         1.00 cm      LV E/e' medial:  6.4 LV IVS:        1.00 cm      LV e' lateral:   12.30 cm/s LVOT diam:     2.20 cm      LV E/e' lateral: 4.8 LV SV:         62 LV SV Index:   28 LVOT Area:     3.80 cm  LV Volumes (MOD) LV vol d, MOD A2C: 128.0 ml LV vol d, MOD A4C: 106.0 ml LV vol s, MOD A2C: 53.7 ml LV vol s, MOD A4C: 46.2 ml LV SV MOD A2C:     74.3 ml LV SV MOD A4C:     106.0 ml LV SV MOD BP:      68.9 ml RIGHT VENTRICLE             IVC RV Basal diam:  3.90 cm     IVC diam: 1.80 cm RV S prime:     11.10 cm/s TAPSE (M-mode): 2.1 cm LEFT ATRIUM             Index        RIGHT ATRIUM           Index LA diam:        3.50 cm 1.60 cm/m   RA Area:  16.10 cm LA Vol (A2C):   57.4 ml 26.22 ml/m  RA Volume:   43.70 ml  19.97 ml/m LA Vol (A4C):   66.7 ml 30.47 ml/m LA Biplane Vol: 68.9 ml 31.48 ml/m  AORTIC VALVE LVOT Vmax:    82.20 cm/s LVOT Vmean:  51.000 cm/s LVOT VTI:    0.163 m  AORTA Ao Root diam: 3.40 cm Ao Asc diam:  3.00 cm MITRAL VALVE MV Area (PHT): 2.55 cm    SHUNTS MV Decel Time: 297 msec    Systemic VTI:  0.16 m MV E velocity: 59.10 cm/s  Systemic Diam: 2.20 cm MV A velocity: 31.30 cm/s MV E/A ratio:  1.89 Dorris Carnes MD Electronically signed by Dorris Carnes MD Signature Date/Time: 06/06/2022/2:46:57 PM    Final    CT EXTREMITY LOWER LEFT W CONTRAST  Result Date: 06/04/2022 CLINICAL DATA:  Left leg pain. EXAM: CT OF THE LOWER LEFT EXTREMITY WITH CONTRAST TECHNIQUE: Multidetector CT imaging of the lower left extremity was performed according to the standard protocol following intravenous contrast administration. RADIATION DOSE REDUCTION: This exam was performed according to the departmental dose-optimization program which includes automated exposure control, adjustment of the mA and/or kV according to patient size and/or use of iterative reconstruction technique. CONTRAST:  143m OMNIPAQUE IOHEXOL 300 MG/ML  SOLN COMPARISON:  Left tibia and fibula x-rays from same day. Left knee x-rays dated May 28, 2022. FINDINGS: Bones/Joint/Cartilage No fracture or dislocation. Joint spaces are preserved. Moderate knee joint effusion. Ligaments Ligaments are suboptimally evaluated by CT. Muscles and Tendons Large multiloculated rim enhancing fluid collection involving the medial and lateral gastrocnemius muscles, measuring approximately 5.1 x 9.6 x 17.1 cm. Soft tissue Scattered superficial soft tissue swelling. No subcutaneous emphysema. No soft tissue mass. IMPRESSION: 1. Large multiloculated rim enhancing fluid collection involving the medial and lateral gastrocnemius muscles, concerning for intramuscular abscess. Myonecrosis is less likely given the absence of diabetes and normal CK level. 2. Moderate knee joint effusion, nonspecific. Consider arthrocentesis. Electronically Signed   By: WTitus DubinM.D.   On: 06/04/2022 17:17      Assessment/Plan: Large deep tissue/intra-muscular abscess of left leg with secondary bacteremia (possibly polymicrobial) - will narrow to vancomycin and await sensitivities. Continue would care to left leg per orthopedics.  [facility] Pager: [phone]  06/06/2022, 3:51 PM

## 2022-06-06 NOTE — Plan of Care (Signed)
  Problem: Coping: Goal: Level of anxiety will decrease Outcome: Progressing   Problem: Pain Managment: Goal: General experience of comfort will improve Outcome: Progressing   Problem: Safety: Goal: Ability to remain free from injury will improve Outcome: Progressing   

## 2022-06-06 NOTE — Progress Notes (Signed)
Initial Nutrition Assessment  DOCUMENTATION CODES:   Obesity unspecified  INTERVENTION:  - Regular diet.  - Ensure Max po BID, each supplement provides 150 kcal and 30 grams of protein.  - Encourage intake of protein rich food sources at all meals.  - Monitor weight trends.    NUTRITION DIAGNOSIS:   Unintentional weight loss related to poor appetite (inadequate oral intake) as evidenced by percent weight loss (4.6% in ~1 week).  GOAL:   Patient will meet greater than or equal to 90% of their needs  MONITOR:   PO intake, Supplement acceptance, Weight trends  REASON FOR ASSESSMENT:   Consult Assessment of nutrition requirement/status  ASSESSMENT:    52 y.o. female with medical history significant of GERD, HTN , obesity who presented with two weeks history of left lower extremity calf pain with progressive swelling and redness x 2 weeks.  3/2 S/p I&D of L calf abscess and L knee arthrocentesis   Patient reports she is unsure of a UBW but feels she has probably lost weight the past 2 weeks since being sick and not eating well.  Per EMR, patient weighed at 259# in late February and now weighed at 247# - a 12# or 4.6% weight loss in ~1 week. This is significant for the time frame.   Patient states that over the past ~2 weeks she has only been eating easy foods like crackers, toast, rice that were easy for her to digest. Has had a poor appetite within the time frame. Was drinking Premier Protein prior to admission.  Patient notes that before issues with eating and appetite she was usually eating 3 meals a day.   Thankfully, patient reports she has been able to eat better the past day. Had spaghetti yesterday and had eggs and yogurt for breakfast this morning and tolerated well.  Discussed importance of adequate intake during admission and focusing on protein rich food sources at all meals.  She is agreeable to try Ensure Max to support intake.    Medications reviewed and  include: Colace, Insulin, Senokot, Vancomycin  Labs reviewed:  -   NUTRITION - FOCUSED PHYSICAL EXAM:  Flowsheet Row Most Recent Value  Orbital Region No depletion  Upper Arm Region No depletion  Thoracic and Lumbar Region No depletion  Buccal Region No depletion  Temple Region No depletion  Clavicle Bone Region No depletion  Clavicle and Acromion Bone Region No depletion  Scapular Bone Region Unable to assess  Dorsal Hand No depletion  Patellar Region No depletion  Anterior Thigh Region No depletion  Posterior Calf Region No depletion  Edema (RD Assessment) None  Hair Reviewed  Eyes Reviewed  Mouth Reviewed  Skin Reviewed  Nails Reviewed       Diet Order:   Diet Order             Diet regular Room service appropriate? Yes; Fluid consistency: Thin  Diet effective now                   EDUCATION NEEDS:  Education needs have been addressed  Skin:  Skin Assessment: Skin Integrity Issues: Skin Integrity Issues:: Incisions Incisions: L leg  Last BM:  3/3  Height:  Ht Readings from Last 1 Encounters:  06/05/22 '5\' 6"'$  (1.676 m)   Weight:  Wt Readings from Last 1 Encounters:  06/05/22 112.2 kg    BMI:  Body mass index is 39.92 kg/m.  Estimated Nutritional Needs:  Kcal:  2100-2300 kcals Protein:  100-125 grams  Fluid:  >/= 2.1L    Samson Frederic RD, LDN For contact information, refer to [facility].

## 2022-06-07 ENCOUNTER — Other Ambulatory Visit (HOSPITAL_COMMUNITY): Payer: Self-pay

## 2022-06-07 DIAGNOSIS — L02416 Cutaneous abscess of left lower limb: Secondary | ICD-10-CM | POA: Diagnosis not present

## 2022-06-07 LAB — CBC
HCT: 38.2 % (ref 36.0–46.0)
Hemoglobin: 11.5 g/dL — ABNORMAL LOW (ref 12.0–15.0)
MCH: 27.6 pg (ref 26.0–34.0)
MCHC: 30.1 g/dL (ref 30.0–36.0)
MCV: 91.8 fL (ref 80.0–100.0)
Platelets: 388 10*3/uL (ref 150–400)
RBC: 4.16 MIL/uL (ref 3.87–5.11)
RDW: 13.4 % (ref 11.5–15.5)
WBC: 10.7 10*3/uL — ABNORMAL HIGH (ref 4.0–10.5)
nRBC: 0 % (ref 0.0–0.2)

## 2022-06-07 LAB — GLUCOSE, CAPILLARY
Glucose-Capillary: 100 mg/dL — ABNORMAL HIGH (ref 70–99)
Glucose-Capillary: 96 mg/dL (ref 70–99)

## 2022-06-07 LAB — HEMOGLOBIN A1C
Hgb A1c MFr Bld: 6.2 % — ABNORMAL HIGH (ref 4.8–5.6)
Mean Plasma Glucose: 131 mg/dL

## 2022-06-07 LAB — CULTURE, BLOOD (ROUTINE X 2): Special Requests: ADEQUATE

## 2022-06-07 MED ORDER — LINEZOLID 600 MG PO TABS: 600.0000 mg | ORAL_TABLET | Freq: Two times a day (BID) | ORAL | 0 refills | Status: DC

## 2022-06-07 MED ORDER — LINEZOLID 600 MG PO TABS
600.0000 mg | ORAL_TABLET | Freq: Two times a day (BID) | ORAL | Status: DC
  Filled 2022-06-07: qty 1

## 2022-06-07 NOTE — Progress Notes (Signed)
[facility] for Infectious Disease    Date of Admission:  06/04/2022   Total days of antibiotics 3   ID: Connie Castaneda is a 52 y.o. female with   Principal Problem:   Abscess of left lower extremity    Subjective: Afebrile, gets nauseated from pain medication.  Medications:   colchicine  0.6 mg Oral Daily   docusate sodium  100 mg Oral BID   enoxaparin (LOVENOX) injection  40 mg Subcutaneous Q24H   metoprolol tartrate  50 mg Oral Daily   pantoprazole  40 mg Oral Daily   Ensure Max Protein  11 oz Oral BID   senna  1 tablet Oral BID    Objective: Vital signs in last 24 hours: Temp:  [97.5 F (36.4 C)-98.3 F (36.8 C)] 98.3 F (36.8 C) (03/05 0628) Pulse Rate:  [58-78] 75 (03/05 0628) Resp:  [17-20] 20 (03/05 0628) BP: (107-123)/(56-62) 123/62 (03/05 0628) SpO2:  [97 %-100 %] 97 % (03/05 [id number])  Physical Exam  Constitutional:  oriented to person, place, and time. appears well-developed and well-nourished. No distress.  HENT: [place]/AT, PERRLA, no scleral icterus Mouth/Throat: Oropharynx is clear and moist. No oropharyngeal exudate.  Cardiovascular: Normal rate, regular rhythm and normal heart sounds. Exam reveals no gallop and no friction rub.  No murmur heard.  Pulmonary/Chest: Effort normal and breath sounds normal. No respiratory distress.  has no wheezes.  Neck = supple, no nuchal rigidity [id number] leg wrapped Skin: Skin is warm and dry. No rash noted. No erythema.  Psychiatric: a normal mood and affect.  behavior is normal.    Lab Results Recent Labs    06/05/22 0258 06/06/22 0259 06/07/22 0340  WBC 19.1* 20.8* 10.7*  HGB 11.6* 11.5* 11.5*  HCT 36.6 36.4 38.2  NA 132* 136  --   K 4.4 3.9  --   CL 95* 102  --   CO2 28 24  --   BUN 12 14  --   CREATININE 0.76 0.60  --    Liver Panel Recent Labs    06/04/22 1405 06/05/22 0258  PROT 8.6* 7.7  ALBUMIN 2.8* 2.4*  AST 22 27  ALT 13 15  ALKPHOS 92 83  BILITOT 0.7 0.7   Sedimentation Rate No  results for input(s): "ESRSEDRATE" in the last 72 hours. C-Reactive Protein No results for input(s): "CRP" in the last 72 hours.  Microbiology: 3/3 OR cx: +MRSA ( bactrim R, tetra S) 3/2 blood cx - staph hominis and strep mitis = in 1 set Studies/Results: ECHOCARDIOGRAM COMPLETE  Result Date: 06/06/2022    ECHOCARDIOGRAM REPORT   Patient Name:   Connie Castaneda Date of Exam: 06/06/2022 Medical Rec #:  [id number]   Height:       66.0 in Accession #:    [id number]  Weight:       247.4 lb Date of Birth:  Jan 20, 1971   BSA:          2.189 m Patient Age:    63 years    BP:           139/84 mmHg Patient Gender: F           HR:           64 bpm. Exam Location:  Inpatient Procedure: 2D Echo, Cardiac Doppler and Color Doppler Indications:    bacteremia  History:        Patient has no prior history of Echocardiogram examinations.  Risk Factors:Hypertension.  Sonographer:    Phineas Douglas Referring Phys: [place]  1. Left ventricular ejection fraction, by estimation, is 55 to 60%. The left ventricle has normal function. The left ventricle has no regional wall motion abnormalities. Left ventricular diastolic parameters were normal.  2. Right ventricular systolic function is normal. The right ventricular size is normal.  3. The mitral valve is normal in structure. Trivial mitral valve regurgitation.  4. The aortic valve is tricuspid. Aortic valve regurgitation is not visualized. FINDINGS  Left Ventricle: Left ventricular ejection fraction, by estimation, is 55 to 60%. The left ventricle has normal function. The left ventricle has no regional wall motion abnormalities. The left ventricular internal cavity size was normal in size. There is  no left ventricular hypertrophy. Left ventricular diastolic parameters were normal. Right Ventricle: The right ventricular size is normal. Right vetricular wall thickness was not assessed. Right ventricular systolic function is normal. Left Atrium: Left  atrial size was normal in size. Right Atrium: Right atrial size was normal in size. Pericardium: There is no evidence of pericardial effusion. Mitral Valve: The mitral valve is normal in structure. Trivial mitral valve regurgitation. Tricuspid Valve: The tricuspid valve is normal in structure. Tricuspid valve regurgitation is trivial. Aortic Valve: The aortic valve is tricuspid. Aortic valve regurgitation is not visualized. Pulmonic Valve: The pulmonic valve was normal in structure. Pulmonic valve regurgitation is not visualized. Aorta: The aortic root and ascending aorta are structurally normal, with no evidence of dilitation. IAS/Shunts: No atrial level shunt detected by color flow Doppler.  LEFT VENTRICLE PLAX 2D LVIDd:         4.70 cm      Diastology LVIDs:         2.90 cm      LV e' medial:    9.25 cm/s LV PW:         1.00 cm      LV E/e' medial:  6.4 LV IVS:        1.00 cm      LV e' lateral:   12.30 cm/s LVOT diam:     2.20 cm      LV E/e' lateral: 4.8 LV SV:         62 LV SV Index:   28 LVOT Area:     3.80 cm  LV Volumes (MOD) LV vol d, MOD A2C: 128.0 ml LV vol d, MOD A4C: 106.0 ml LV vol s, MOD A2C: 53.7 ml LV vol s, MOD A4C: 46.2 ml LV SV MOD A2C:     74.3 ml LV SV MOD A4C:     106.0 ml LV SV MOD BP:      68.9 ml RIGHT VENTRICLE             IVC RV Basal diam:  3.90 cm     IVC diam: 1.80 cm RV S prime:     11.10 cm/s TAPSE (M-mode): 2.1 cm LEFT ATRIUM             Index        RIGHT ATRIUM           Index LA diam:        3.50 cm 1.60 cm/m   RA Area:     16.10 cm LA Vol (A2C):   57.4 ml 26.22 ml/m  RA Volume:   43.70 ml  19.97 ml/m LA Vol (A4C):   66.7 ml 30.47 ml/m LA Biplane Vol: 68.9 ml 31.48 ml/m  AORTIC VALVE LVOT Vmax:   82.20 cm/s LVOT Vmean:  51.000 cm/s LVOT VTI:    0.163 m  AORTA Ao Root diam: 3.40 cm Ao Asc diam:  3.00 cm MITRAL VALVE MV Area (PHT): 2.55 cm    SHUNTS MV Decel Time: 297 msec    Systemic VTI:  0.16 m MV E velocity: 59.10 cm/s  Systemic Diam: 2.20 cm MV A velocity: 31.30 cm/s  MV E/A ratio:  1.89 Dorris Carnes MD Electronically signed by Dorris Carnes MD Signature Date/Time: 06/06/2022/2:46:57 PM    Final      Assessment/Plan: 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 Wound  care per surgery  Leukocytosis = improved since IX D and abtx  Nausea with pain meds = would d/c with an anti-emetic  Bacteremia = suspect it is contaminant given more than 1 isolate and not found in the OR deep tissue cultures  We will see back in the ID clinic in 2 wk for follow up and see if need to extend abtx.  [facility] for Infectious Diseases Pager: [phone]  06/07/2022, 12:46 PM

## 2022-06-07 NOTE — Plan of Care (Signed)
Pt ready to DC home on PO abx.

## 2022-06-07 NOTE — Plan of Care (Signed)
Plan of care reviewed and discussed. 

## 2022-06-07 NOTE — Discharge Summary (Signed)
Physician Discharge Summary   Patient: Connie Castaneda MRN: [id number] DOB: 1971-03-04  Admit date:     06/04/2022  Discharge date: 06/07/22  Discharge Physician: Patrecia Pour   PCP: Lin Landsman, MD   Recommendations at discharge:  Follow up with orthopedics in 2 weeks post I&D left calf abscess on 3/2. Continue wound care per their recommendations. Follow up with [facility] in 2 weeks, discharging with 14 days of linezolid (copay confirmed to be $4).  Follow up with PCP for prediabetes, HbA1c 6.2%.  Discharge Diagnoses: Principal Problem:   Abscess of left lower extremity  Hospital Course: Connie Castaneda is a 52 y.o. female with a history of HTN, GERD, obesity (BMI 39) who presented to the ED by EMS on 06/04/2022 due to 2 weeks of worsening left calf/posterior knee pain, swelling, redness. She was found to be febrile to 100.72F with leukocytosis (WBC 15.5k) with normal lactic acid and CK levels. Imaging revealed a large multiloculated rim enhancing fluid collection involving the medial and lateral gastrocnemius muscles, concerning for intramuscular abscess and a moderate knee joint effusion. Operative I&D and arthrocentesis performed on 3/2. ID was consulted and broad IV antibiotics continued pending culture data. Wound culture grew MRSA for which antibiotics are changed to linezolid per ID recommendations. Blood culture findings suspected to be contamination, synovial culture negative, TTE without vegetation.   Assessment and Plan: Sepsis due to left lower extremity MRSA abscess s/p I&D of left calf and posterior knee, left knee arthrocentesis 3/2 by Dr. Doran Durand. - Continue wound care per orthopedics, follow up in 2 weeks, they've sent in pain medications though the patient has only been taking tylenol of late.  - Prescribed linezolid 14 days at discharge per ID recommendations. Will follow up in [facility] in 2 weeks. - Drain pulled, local wound care is per orthopedics.    Bacteremia: GPC in chains (1 of 4  bottles) and clusters (different 1 of 4 bottles) growing thus far. Suspected contaminant. TTE without vegetations, essentially normal.     HTN:  - Continue home metoprolol   Obesity: Body mass index is 39.92 kg/m.  - With abscess and hyperglycemia, checked HbA1c which was 6.2%. Pt having some nausea with pain medications, so will not initiate metformin at this time.    Hyponatremia: Mild, resolved   Hypoalbuminemia:  - RD consult   Gout:  - Continue colchicine   GERD:  - Continue home pepcid, PPI  Consultants: ID, orthopedics Procedures performed:  06/04/22 IRRIGATION AND DEBRIDEMENT EXTREMITY LEFT CALF ABSCESS, LEFT KNEE ARTHROCENTESIS Wylene Simmer, MD  Disposition: Home Diet recommendation:  Carb modified diet DISCHARGE MEDICATION: Allergies as of 06/07/2022   No Known Allergies      Medication List     STOP taking these medications    cephALEXin 500 MG capsule Commonly known as: Keflex   famotidine 20 MG tablet Commonly known as: PEPCID   HYDROcodone-acetaminophen 10-325 MG tablet Commonly known as: NORCO   HYDROcodone-acetaminophen 5-325 MG tablet Commonly known as: NORCO/VICODIN   meloxicam 15 MG tablet Commonly known as: MOBIC   predniSONE 20 MG tablet Commonly known as: DELTASONE   selenium sulfide 2.5 % lotion Commonly known as: SELSUN   senna-docusate 8.6-50 MG tablet Commonly known as: Senokot-S   tiZANidine 4 MG tablet Commonly known as: Zanaflex   triamcinolone cream 0.1 % Commonly known as: KENALOG       TAKE these medications    colchicine 0.6 MG tablet Take 0.6 mg by mouth daily.   cyclobenzaprine 10  MG tablet Commonly known as: FLEXERIL Take 10 mg by mouth 3 (three) times daily as needed for muscle spasms.   docusate sodium 100 MG capsule Commonly known as: Colace Take 1 capsule (100 mg total) by mouth 2 (two) times daily. While taking narcotic pain medicine.   linezolid 600 MG tablet Commonly known as: ZYVOX Take 1  tablet (600 mg total) by mouth every 12 (twelve) hours.   metoprolol tartrate 50 MG tablet Commonly known as: LOPRESSOR Take 50 mg by mouth daily.   omeprazole 40 MG capsule Commonly known as: PRILOSEC Take 1 capsule (40 mg total) by mouth 2 (two) times daily. What changed: Another medication with the same name was removed. Continue taking this medication, and follow the directions you see here.   ondansetron 4 MG disintegrating tablet Commonly known as: ZOFRAN-ODT Take 1 tablet (4 mg total) by mouth every 8 (eight) hours as needed. What changed: reasons to take this   oxyCODONE 5 MG immediate release tablet Commonly known as: Roxicodone Take 1 tablet (5 mg total) by mouth every 4 (four) hours as needed for up to 3 days for severe pain.   senna 8.6 MG Tabs tablet Commonly known as: SENOKOT Take 2 tablets (17.2 mg total) by mouth 2 (two) times daily.        Follow-up Information     Wylene Simmer, MD. Schedule an appointment as soon as possible for a visit in 2 week(s).   Specialty: Orthopedic Surgery Contact information: [address] [place] [address] [id number]                Discharge Exam: Danley Danker Weights   06/05/22 0024 06/05/22 0500  Weight: 112.2 kg 112.2 kg  BP 123/62 (BP Location: Left Arm)   Pulse 75   Temp 98.3 F (36.8 C) (Oral)   Resp 20   Ht '5\' 6"'$  (1.676 m)   Wt 112.2 kg   LMP 07/03/2017   SpO2 97%   BMI 39.92 kg/m   No distress Clear, nonlabored RRR, no MRG Soft, NT, ND +BS Left leg ACE wrap c/d/I, distal sensation and motor function intact, cap refill brisk.   Condition at discharge: stable  The results of significant diagnostics from this hospitalization (including imaging, microbiology, ancillary and laboratory) are listed below for reference.   Imaging Studies: ECHOCARDIOGRAM COMPLETE  Result Date: 06/06/2022    ECHOCARDIOGRAM REPORT   Patient Name:   Connie Castaneda Date of Exam: 06/06/2022 Medical Rec #:   [id number]   Height:       66.0 in Accession #:    [id number]  Weight:       247.4 lb Date of Birth:  1970-10-06   BSA:          2.189 m Patient Age:    70 years    BP:           139/84 mmHg Patient Gender: F           HR:           64 bpm. Exam Location:  Inpatient Procedure: 2D Echo, Cardiac Doppler and Color Doppler Indications:    bacteremia  History:        Patient has no prior history of Echocardiogram examinations.                 Risk Factors:Hypertension.  Sonographer:    Phineas Douglas Referring Phys: [place]  1. Left ventricular ejection fraction, by estimation, is 55  to 60%. The left ventricle has normal function. The left ventricle has no regional wall motion abnormalities. Left ventricular diastolic parameters were normal.  2. Right ventricular systolic function is normal. The right ventricular size is normal.  3. The mitral valve is normal in structure. Trivial mitral valve regurgitation.  4. The aortic valve is tricuspid. Aortic valve regurgitation is not visualized. FINDINGS  Left Ventricle: Left ventricular ejection fraction, by estimation, is 55 to 60%. The left ventricle has normal function. The left ventricle has no regional wall motion abnormalities. The left ventricular internal cavity size was normal in size. There is  no left ventricular hypertrophy. Left ventricular diastolic parameters were normal. Right Ventricle: The right ventricular size is normal. Right vetricular wall thickness was not assessed. Right ventricular systolic function is normal. Left Atrium: Left atrial size was normal in size. Right Atrium: Right atrial size was normal in size. Pericardium: There is no evidence of pericardial effusion. Mitral Valve: The mitral valve is normal in structure. Trivial mitral valve regurgitation. Tricuspid Valve: The tricuspid valve is normal in structure. Tricuspid valve regurgitation is trivial. Aortic Valve: The aortic valve is tricuspid. Aortic valve regurgitation is  not visualized. Pulmonic Valve: The pulmonic valve was normal in structure. Pulmonic valve regurgitation is not visualized. Aorta: The aortic root and ascending aorta are structurally normal, with no evidence of dilitation. IAS/Shunts: No atrial level shunt detected by color flow Doppler.  LEFT VENTRICLE PLAX 2D LVIDd:         4.70 cm      Diastology LVIDs:         2.90 cm      LV e' medial:    9.25 cm/s LV PW:         1.00 cm      LV E/e' medial:  6.4 LV IVS:        1.00 cm      LV e' lateral:   12.30 cm/s LVOT diam:     2.20 cm      LV E/e' lateral: 4.8 LV SV:         62 LV SV Index:   28 LVOT Area:     3.80 cm  LV Volumes (MOD) LV vol d, MOD A2C: 128.0 ml LV vol d, MOD A4C: 106.0 ml LV vol s, MOD A2C: 53.7 ml LV vol s, MOD A4C: 46.2 ml LV SV MOD A2C:     74.3 ml LV SV MOD A4C:     106.0 ml LV SV MOD BP:      68.9 ml RIGHT VENTRICLE             IVC RV Basal diam:  3.90 cm     IVC diam: 1.80 cm RV S prime:     11.10 cm/s TAPSE (M-mode): 2.1 cm LEFT ATRIUM             Index        RIGHT ATRIUM           Index LA diam:        3.50 cm 1.60 cm/m   RA Area:     16.10 cm LA Vol (A2C):   57.4 ml 26.22 ml/m  RA Volume:   43.70 ml  19.97 ml/m LA Vol (A4C):   66.7 ml 30.47 ml/m LA Biplane Vol: 68.9 ml 31.48 ml/m  AORTIC VALVE LVOT Vmax:   82.20 cm/s LVOT Vmean:  51.000 cm/s LVOT VTI:    0.163 m  AORTA Ao Root diam:  3.40 cm Ao Asc diam:  3.00 cm MITRAL VALVE MV Area (PHT): 2.55 cm    SHUNTS MV Decel Time: 297 msec    Systemic VTI:  0.16 m MV E velocity: 59.10 cm/s  Systemic Diam: 2.20 cm MV A velocity: 31.30 cm/s MV E/A ratio:  1.89 Dorris Carnes MD Electronically signed by Dorris Carnes MD Signature Date/Time: 06/06/2022/2:46:57 PM    Final    VAS [place] LOWER EXTREMITY VENOUS (DVT)  Result Date: 06/04/2022  Lower Venous DVT Study Patient Name:  EVVY PHARO  Date of Exam:   06/04/2022 Medical Rec #: [id number]    Accession #:    [id number] Date of Birth: [birth year]    Patient Gender: F Patient Age:   38 years Exam Location:   [facility] Procedure:      VAS [place] LOWER EXTREMITY VENOUS (DVT) Referring Phys: Davonna Belling --------------------------------------------------------------------------------  Indications: Pain, and Swelling.  Limitations: Poor ultrasound/tissue interface and pain intolerance. Comparison Study: Previous exam on 05/27/22 was negative for DVT Performing Technologist: Rogelia Rohrer RVT, RDMS  Examination Guidelines: A complete evaluation includes B-mode imaging, spectral Doppler, color Doppler, and power Doppler as needed of all accessible portions of each vessel. Bilateral testing is considered an integral part of a complete examination. Limited examinations for reoccurring indications may be performed as noted. The reflux portion of the exam is performed with the patient in reverse Trendelenburg.  +-----+---------------+---------+-----------+----------+--------------+ RIGHTCompressibilityPhasicitySpontaneityPropertiesThrombus Aging +-----+---------------+---------+-----------+----------+--------------+ CFV  Full           Yes      Yes                                 +-----+---------------+---------+-----------+----------+--------------+   +---------+---------------+---------+-----------+----------+-------------------+ LEFT     CompressibilityPhasicitySpontaneityPropertiesThrombus Aging      +---------+---------------+---------+-----------+----------+-------------------+ CFV      Full           Yes      Yes                                      +---------+---------------+---------+-----------+----------+-------------------+ SFJ      Full                                                             +---------+---------------+---------+-----------+----------+-------------------+ FV Prox  Full           Yes      Yes                                      +---------+---------------+---------+-----------+----------+-------------------+ FV Mid   Full           Yes      Yes                                       +---------+---------------+---------+-----------+----------+-------------------+ FV DistalFull           Yes      Yes                                      +---------+---------------+---------+-----------+----------+-------------------+  PFV      Full                                                             +---------+---------------+---------+-----------+----------+-------------------+ POP      Full           Yes      Yes                                      +---------+---------------+---------+-----------+----------+-------------------+ PTV                                                   Not well visualized +---------+---------------+---------+-----------+----------+-------------------+ PERO                                                  not visualized      +---------+---------------+---------+-----------+----------+-------------------+  Left Technical Findings: Not visualized segments include peroneal veins.   Summary: RIGHT: - No evidence of common femoral vein obstruction.  LEFT: - There is no evidence of deep vein thrombosis in the lower extremity. However, portions of this examination were limited- see technologist comments above.  - No cystic structure found in the popliteal fossa.  *See table(s) above for measurements and observations. Electronically signed by Monica Martinez MD on 06/04/2022 at 5:18:03 PM.    Final    CT EXTREMITY LOWER LEFT W CONTRAST  Result Date: 06/04/2022 CLINICAL DATA:  Left leg pain. EXAM: CT OF THE LOWER LEFT EXTREMITY WITH CONTRAST TECHNIQUE: Multidetector CT imaging of the lower left extremity was performed according to the standard protocol following intravenous contrast administration. RADIATION DOSE REDUCTION: This exam was performed according to the departmental dose-optimization program which includes automated exposure control, adjustment of the mA and/or kV according to patient size and/or use  of iterative reconstruction technique. CONTRAST:  158m OMNIPAQUE IOHEXOL 300 MG/ML  SOLN COMPARISON:  Left tibia and fibula x-rays from same day. Left knee x-rays dated May 28, 2022. FINDINGS: Bones/Joint/Cartilage No fracture or dislocation. Joint spaces are preserved. Moderate knee joint effusion. Ligaments Ligaments are suboptimally evaluated by CT. Muscles and Tendons Large multiloculated rim enhancing fluid collection involving the medial and lateral gastrocnemius muscles, measuring approximately 5.1 x 9.6 x 17.1 cm. Soft tissue Scattered superficial soft tissue swelling. No subcutaneous emphysema. No soft tissue mass. IMPRESSION: 1. Large multiloculated rim enhancing fluid collection involving the medial and lateral gastrocnemius muscles, concerning for intramuscular abscess. Myonecrosis is less likely given the absence of diabetes and normal CK level. 2. Moderate knee joint effusion, nonspecific. Consider arthrocentesis. Electronically Signed   By: WTitus DubinM.D.   On: 06/04/2022 17:17   DG Tibia/Fibula Left  Result Date: 06/04/2022 CLINICAL DATA:  Swelling without injury EXAM: LEFT TIBIA AND FIBULA - 2 VIEW COMPARISON:  None Available. FINDINGS: An anchor in the posterior calcaneus is likely from Achilles repair. Soft tissue swelling identified. No foreign bodies. No soft tissue gas. No fracture or dislocation.  No bony erosion. IMPRESSION: Soft tissue swelling. No other acute abnormalities. Electronically Signed   By: Dorise Bullion III M.D.   On: 06/04/2022 14:02   DG Knee Complete 4 Views Left  Result Date: 05/28/2022 CLINICAL DATA:  Swelling and leg pain EXAM: LEFT KNEE - COMPLETE 4+ VIEW COMPARISON:  None Available. FINDINGS: No evidence of fracture or dislocation. Small joint effusion. No evidence of arthropathy or other focal bone abnormality. Soft tissues are unremarkable. IMPRESSION: No acute fracture or dislocation. Electronically Signed   By: Placido Sou M.D.   On:  05/28/2022 02:57   VAS [place] LOWER EXTREMITY VENOUS (DVT)  Result Date: 05/27/2022  Lower Venous DVT Study Patient Name:  SAACHI GIGER  Date of Exam:   05/27/2022 Medical Rec #: [id number]    Accession #:    [id number] Date of Birth: 10-19-1970    Patient Gender: F Patient Age:   42 years Exam Location:  [facility] Procedure:      VAS [place] LOWER EXTREMITY VENOUS (DVT) Referring Phys: Vonna Kotyk CHADWELL --------------------------------------------------------------------------------  Indications: Patient reports worsening pain and swelling in the left posterior knee and calf since Monday, 05/23/2022. She denies any SOB.  Risk Factors: None identified. Comparison Study: NA Performing Technologist: Sharlett Iles RVT  Examination Guidelines: A complete evaluation includes B-mode imaging, spectral Doppler, color Doppler, and power Doppler as needed of all accessible portions of each vessel. Bilateral testing is considered an integral part of a complete examination. Limited examinations for reoccurring indications may be performed as noted. The reflux portion of the exam is performed with the patient in reverse Trendelenburg.  +---------+---------------+---------+-----------+----------+--------------+ LEFT     CompressibilityPhasicitySpontaneityPropertiesThrombus Aging +---------+---------------+---------+-----------+----------+--------------+ CFV      Full           Yes      Yes                                 +---------+---------------+---------+-----------+----------+--------------+ SFJ      Full           Yes      Yes                                 +---------+---------------+---------+-----------+----------+--------------+ FV Prox  Full           Yes      Yes                                 +---------+---------------+---------+-----------+----------+--------------+ FV Mid   Full                                                         +---------+---------------+---------+-----------+----------+--------------+ FV DistalFull           Yes      Yes                                 +---------+---------------+---------+-----------+----------+--------------+ PFV      Full                                                        +---------+---------------+---------+-----------+----------+--------------+  POP      Full           Yes      Yes                                 +---------+---------------+---------+-----------+----------+--------------+ PTV      Full                                                        +---------+---------------+---------+-----------+----------+--------------+ PERO     Full                                                        +---------+---------------+---------+-----------+----------+--------------+ Gastroc  Full                                                        +---------+---------------+---------+-----------+----------+--------------+ GSV      Full           Yes      Yes                                 +---------+---------------+---------+-----------+----------+--------------+   Left Technical Findings: Rouleau flow noted in the CFV, SFJ and proximal great saphenous vein. Edema noted throughout the medial calf. Limited visualization of the PTVs and peroneal veins due to body habitus, and increase lower leg swelling and edema. Specifically, the popliteal  vein and tibioperoneal confluence are without thrombus.   Summary: RIGHT: - No evidence of common femoral vein obstruction.  LEFT: - No evidence of deep vein thrombosis in the lower extremity. No indirect evidence of obstruction proximal to the inguinal ligament. - No cystic structure found in the popliteal fossa.  *See table(s) above for measurements and observations. Electronically signed by Orlie Pollen on 05/27/2022 at 6:44:14 PM.    Final    DG Ribs Unilateral W/Chest Right  Result Date: 05/25/2022 CLINICAL  DATA:  Right chest wall pain EXAM: RIGHT RIBS AND CHEST - 3+ VIEW COMPARISON:  07/18/2013 FINDINGS: Heart and mediastinal shadows are normal. Lungs are clear. Normal appearance of the right ribs. IMPRESSION: No active disease. Normal appearance of the right ribs. Electronically Signed   By: Nelson Chimes M.D.   On: 05/25/2022 15:32    Microbiology: Results for orders placed or performed during the hospital encounter of 06/04/22  Culture, blood (routine x 2)     Status: None (Preliminary result)   Collection Time: 06/04/22  2:05 PM   Specimen: BLOOD  Result Value Ref Range Status   Specimen Description   Final    BLOOD RIGHT ANTECUBITAL Performed at [facility] [address] [place], [place] [address]    Special Requests   Final    BOTTLES DRAWN AEROBIC AND ANAEROBIC Blood Culture adequate volume Performed at [facility] [address], [place], [place] [address]    Culture  Final    NO GROWTH 3 DAYS Performed at [facility], [place] [address], [place], [place] [address]    Report Status PENDING  Incomplete  Culture, blood (routine x 2)     Status: Abnormal   Collection Time: 06/04/22  2:19 PM   Specimen: BLOOD  Result Value Ref Range Status   Specimen Description   Final    BLOOD BLOOD RIGHT HAND Performed at [facility] [address], [place], [place] [address]    Special Requests   Final    BOTTLES DRAWN AEROBIC AND ANAEROBIC Blood Culture adequate volume Performed at [facility] [address] [place], [place] [address]    Culture  Setup Time   Final    ANAEROBIC BOTTLE ONLY GRAM POSITIVE COCCI IN CHAINS Organism ID to follow AEROBIC BOTTLE ONLY GRAM POSITIVE COCCI IN CLUSTERS CRITICAL RESULT CALLED TO, READ BACK BY AND VERIFIED WITH:  C/ PHARMD M. JAMES 06/05/22 1225 A. LAFRANCE  C/ [place] 06/05/22 1256 A. LAFRANCE    Culture (A)  Final    STREPTOCOCCUS  MITIS/ORALIS STAPHYLOCOCCUS HOMINIS THE SIGNIFICANCE OF ISOLATING THIS ORGANISM FROM A SINGLE VENIPUNCTURE CANNOT BE PREDICTED WITHOUT FURTHER CLINICAL AND CULTURE CORRELATION. SUSCEPTIBILITIES AVAILABLE ONLY ON REQUEST. Performed at [facility], [place] [address] [place], [place] [address]    Report Status 06/07/2022 FINAL  Final  Blood Culture ID Panel (Reflexed)     Status: Abnormal   Collection Time: 06/04/22  2:19 PM  Result Value Ref Range Status   Enterococcus faecalis NOT DETECTED NOT DETECTED Final   Enterococcus Faecium NOT DETECTED NOT DETECTED Final   Listeria monocytogenes NOT DETECTED NOT DETECTED Final   Staphylococcus species NOT DETECTED NOT DETECTED Final   Staphylococcus aureus (BCID) NOT DETECTED NOT DETECTED Final   Staphylococcus epidermidis NOT DETECTED NOT DETECTED Final   Staphylococcus lugdunensis NOT DETECTED NOT DETECTED Final   Streptococcus species DETECTED (A) NOT DETECTED Final    Comment: Not Enterococcus species, Streptococcus agalactiae, Streptococcus pyogenes, or Streptococcus pneumoniae. CRITICAL RESULT CALLED TO, READ BACK BY AND VERIFIED WITH:  C/ PHARMD M. JAMES 06/05/22 1225 A. LAFRANCE    Streptococcus agalactiae NOT DETECTED NOT DETECTED Final   Streptococcus pneumoniae NOT DETECTED NOT DETECTED Final   Streptococcus pyogenes NOT DETECTED NOT DETECTED Final   A.calcoaceticus-baumannii NOT DETECTED NOT DETECTED Final   Bacteroides fragilis NOT DETECTED NOT DETECTED Final   Enterobacterales NOT DETECTED NOT DETECTED Final   Enterobacter cloacae complex NOT DETECTED NOT DETECTED Final   Escherichia coli NOT DETECTED NOT DETECTED Final   Klebsiella aerogenes NOT DETECTED NOT DETECTED Final   Klebsiella oxytoca NOT DETECTED NOT DETECTED Final   Klebsiella pneumoniae NOT DETECTED NOT DETECTED Final   Proteus species NOT DETECTED NOT DETECTED Final   Salmonella species NOT DETECTED NOT DETECTED Final   Serratia marcescens NOT DETECTED NOT  DETECTED Final   Haemophilus influenzae NOT DETECTED NOT DETECTED Final   Neisseria meningitidis NOT DETECTED NOT DETECTED Final   Pseudomonas aeruginosa NOT DETECTED NOT DETECTED Final   Stenotrophomonas maltophilia NOT DETECTED NOT DETECTED Final   Candida albicans NOT DETECTED NOT DETECTED Final   Candida auris NOT DETECTED NOT DETECTED Final   Candida glabrata NOT DETECTED NOT DETECTED Final   Candida krusei NOT DETECTED NOT DETECTED Final   Candida parapsilosis NOT DETECTED NOT DETECTED Final   Candida tropicalis NOT DETECTED NOT DETECTED Final   Cryptococcus neoformans/gattii NOT DETECTED NOT DETECTED Final  Comment: Performed at [facility], [place] [address], [place], [place] [address]  Aerobic/Anaerobic Culture w Gram Stain (surgical/deep wound)     Status: None (Preliminary result)   Collection Time: 06/04/22  9:48 PM   Specimen: Abscess  Result Value Ref Range Status   Specimen Description ABSCESS LEFT CALF  Final   Special Requests ID A  Final   Gram Stain   Final    ABUNDANT WBC PRESENT,BOTH PMN AND MONONUCLEAR FEW GRAM POSITIVE COCCI IN CLUSTERS    Culture   Final    ABUNDANT METHICILLIN RESISTANT STAPHYLOCOCCUS AUREUS   Report Status PENDING  Incomplete   Organism ID, Bacteria METHICILLIN RESISTANT STAPHYLOCOCCUS AUREUS  Final      Susceptibility   Methicillin resistant staphylococcus aureus - MIC*    CIPROFLOXACIN >=8 RESISTANT Resistant     ERYTHROMYCIN >=8 RESISTANT Resistant     GENTAMICIN <=0.5 SENSITIVE Sensitive     OXACILLIN >=4 RESISTANT Resistant     TETRACYCLINE <=1 SENSITIVE Sensitive     VANCOMYCIN <=0.5 SENSITIVE Sensitive     TRIMETH/SULFA >=320 RESISTANT Resistant     CLINDAMYCIN <=0.25 SENSITIVE Sensitive     RIFAMPIN <=0.5 SENSITIVE Sensitive     Inducible Clindamycin NEGATIVE Sensitive     LINEZOLID Value in next row Sensitive      SENSITIVE2.0Performed at [facility] [address] [place], [place] [address]    * ABUNDANT  METHICILLIN RESISTANT STAPHYLOCOCCUS AUREUS  Aerobic/Anaerobic Culture w Gram Stain (surgical/deep wound)     Status: None (Preliminary result)   Collection Time: 06/04/22  9:48 PM   Specimen: Synovial, Left Knee; Body Fluid  Result Value Ref Range Status   Specimen Description SYNOVIAL KNEE LEFT FLUID  Final   Special Requests IN STERILE CUP ID B  Final   Gram Stain   Final    FEW WBC PRESENT,BOTH PMN AND MONONUCLEAR NO ORGANISMS SEEN    Culture   Final    NO GROWTH 2 DAYS Performed at [facility], [address] [place], [place] [address]    Report Status PENDING  Incomplete  Culture, blood (Routine X 2) w Reflex to ID Panel     Status: None (Preliminary result)   Collection Time: 06/06/22  2:59 AM   Specimen: BLOOD LEFT HAND  Result Value Ref Range Status   Specimen Description   Final    BLOOD LEFT HAND BOTTLES DRAWN AEROBIC ONLY Performed at [facility] [address], [place], [place] [address]    Special Requests   Final    Blood Culture adequate volume Performed at [facility] [address] [place], [place] [address]    Culture   Final    NO GROWTH 1 DAY Performed at [facility], [place] [address] [place], [place] [address]    Report Status PENDING  Incomplete  Culture, blood (Routine X 2) w Reflex to ID Panel     Status: None (Preliminary result)   Collection Time: 06/06/22  3:03 AM   Specimen: BLOOD RIGHT HAND  Result Value Ref Range Status   Specimen Description   Final    BLOOD RIGHT HAND BOTTLES DRAWN AEROBIC ONLY Performed at [facility] [address], [place], [place] [address]    Special Requests   Final    Blood Culture adequate volume Performed at [facility] [address] [place], [place] [address]    Culture   Final  NO GROWTH 1 DAY Performed at [facility], [place] [address], [place], [place] [address]    Report Status PENDING  Incomplete     Labs: CBC: Recent Labs  Lab 06/04/22 1405 06/04/22 2159 06/05/22 0258 06/06/22 0259 06/07/22 0340  WBC 15.5* 17.2* 19.1* 20.8* 10.7*  NEUTROABS 12.6*  --   --   --   --   HGB 12.4 12.1 11.6* 11.5* 11.5*  HCT 40.8 38.5 36.6 36.4 38.2  MCV 90.5 89.3 89.9 89.4 91.8  PLT 341 371 335 384 [id number]   Basic Metabolic Panel: Recent Labs  Lab 06/04/22 1405 06/04/22 2159 06/05/22 0258 06/06/22 0259  NA 133*  --  132* 136  K 3.9  --  4.4 3.9  CL 94*  --  95* 102  CO2 28  --  28 24  GLUCOSE 116*  --  221* 129*  BUN 10  --  12 14  CREATININE 0.78 0.57 0.76 0.60  CALCIUM 8.2*  --  8.2* 8.4*   Liver Function Tests: Recent Labs  Lab 06/04/22 1405 06/05/22 0258  AST 22 27  ALT 13 15  ALKPHOS 92 83  BILITOT 0.7 0.7  PROT 8.6* 7.7  ALBUMIN 2.8* 2.4*   CBG: Recent Labs  Lab 06/06/22 1148 06/06/22 1607 06/06/22 2208 06/07/22 0727 06/07/22 1200  GLUCAP 116* 105* 109* 100* 96    Discharge time spent: greater than 30 minutes.  Signed: Patrecia Pour, MD [facility] 06/07/2022

## 2022-06-07 NOTE — TOC Progression Note (Addendum)
Transition of Care [facility]) - Progression Note   Patient Details  Name: Lexee Boldon MRN: [id number] Date of Birth: 1971-03-22  Transition of [facility]) CM/SW [place], LCSW Phone Number: 06/07/2022, 10:00 AM  Clinical Narrative: CSW met with patient to discuss discharge planning. CSW explained that patient may need IV antibiotics at home after discharge. Patient aware [facility] will provide the IV antibiotics if that is the recommendation. Patient reported she does not have any DME at home. TOC to follow.  Addendum: Patient will not discharge home on IV antibiotics. TOC signing off at this time.  Barriers to Discharge: Continued Medical Work up  Social Determinants of Health (SDOH) Interventions SDOH Screenings   Tobacco Use: Low Risk  (06/05/2022)   Readmission Risk Interventions     No data to display

## 2022-06-07 NOTE — TOC Benefit Eligibility Note (Signed)
Patient Teacher, English as a foreign language completed.    The patient is currently admitted and upon discharge could be taking linezolid (Zyvox) 600 mg tablets.  The current 21 day co-pay is $4.00.   The patient is insured through [place], [place] Patient Advocate Specialist [place] Patient Advocate Team Direct Number: [phone]  Fax: [phone]

## 2022-06-09 LAB — CULTURE, BLOOD (ROUTINE X 2)
Culture: NO GROWTH
Special Requests: ADEQUATE

## 2022-06-09 LAB — CYTOLOGY - NON PAP

## 2022-06-10 LAB — AEROBIC/ANAEROBIC CULTURE W GRAM STAIN (SURGICAL/DEEP WOUND): Culture: NO GROWTH

## 2022-06-11 LAB — CULTURE, BLOOD (ROUTINE X 2)
Culture: NO GROWTH
Culture: NO GROWTH
Special Requests: ADEQUATE
Special Requests: ADEQUATE

## 2022-06-21 ENCOUNTER — Emergency Department (HOSPITAL_COMMUNITY): Payer: Medicaid Other

## 2022-06-21 ENCOUNTER — Other Ambulatory Visit: Payer: Self-pay

## 2022-06-21 ENCOUNTER — Emergency Department (HOSPITAL_COMMUNITY)
Admission: EM | Admit: 2022-06-21 | Discharge: 2022-06-21 | Disposition: A | Discharge status: Home / Self Care | Payer: Medicaid Other | Attending: Emergency Medicine | Admitting: Emergency Medicine

## 2022-06-21 DIAGNOSIS — M25522 Pain in left elbow: Secondary | ICD-10-CM | POA: Diagnosis present

## 2022-06-21 DIAGNOSIS — I10 Essential (primary) hypertension: Secondary | ICD-10-CM | POA: Insufficient documentation

## 2022-06-21 DIAGNOSIS — M25422 Effusion, left elbow: Secondary | ICD-10-CM | POA: Diagnosis not present

## 2022-06-21 MED ORDER — INDOMETHACIN 25 MG PO CAPS: 50.0000 mg | ORAL_CAPSULE | Freq: Three times a day (TID) | ORAL | 0 refills | Status: DC

## 2022-06-21 MED ORDER — ONDANSETRON 4 MG PO TBDP
4.0000 mg | ORAL_TABLET | Freq: Once | ORAL | Status: AC
  Administered 2022-06-21: 4 mg via ORAL
  Filled 2022-06-21: qty 1

## 2022-06-21 MED ORDER — HYDROCODONE-ACETAMINOPHEN 5-325 MG PO TABS
1.0000 | ORAL_TABLET | Freq: Once | ORAL | Status: AC
  Administered 2022-06-21: 1 via ORAL
  Filled 2022-06-21: qty 1

## 2022-06-21 NOTE — ED Triage Notes (Signed)
C/o left elbow pain x2 days.  Decreased ROM.  Ice and tylenol w/o relief.  Pt states "I am concerned my bacterial infection from leg has moved to my arm, I seen orthopedic yesterday."

## 2022-06-21 NOTE — Discharge Instructions (Addendum)
Thank you for allowing me to be a part of your care today.  I am sending you home on indomethacin, a strong NSAID to treat for possible gout.  Please follow-up with [facility] on Friday.    Return to the ED if you have worsening of your symptoms or if you have any new concerns.

## 2022-06-21 NOTE — ED Provider Notes (Signed)
[facility] Provider Note   CSN: [id number] Arrival date & time: 06/21/22  1036     History  Chief Complaint  Patient presents with   Extremity Pain    Connie Castaneda is a 52 y.o. female with past medical history significant for hypertension and recent I&D of lower extremity presents to the ED complaining of left elbow pain for the past 2 days with decreased range of motion and some swelling.  She has tried Tylenol and ice without relief.  She states she is concerned her bacterial infection from her leg has moved into her arm.  Patient has been taking her linezolid as prescribed and is close to finishing the course.  She does have a history of gout but reports it has been "awhile" since her last flare up.  Denies fever, chills, redness of the left upper extremity, swelling of the entire extremity, numbness or tingling, weakness.        Home Medications Prior to Admission medications   Medication Sig Start Date End Date Taking? Authorizing Provider  indomethacin (INDOCIN) 25 MG capsule Take 2 capsules (50 mg total) by mouth 3 (three) times daily with meals. 06/21/22  Yes Jeter Tomey R, PA  colchicine 0.6 MG tablet Take 0.6 mg by mouth daily. 05/13/22   [provider]  cyclobenzaprine (FLEXERIL) 10 MG tablet Take 10 mg by mouth 3 (three) times daily as needed for muscle spasms. 05/18/22   [provider]  docusate sodium (COLACE) 100 MG capsule Take 1 capsule (100 mg total) by mouth 2 (two) times daily. While taking narcotic pain medicine. 06/06/22   Corky Sing, PA-C  linezolid (ZYVOX) 600 MG tablet Take 1 tablet (600 mg total) by mouth every 12 (twelve) hours. 06/07/22   Patrecia Pour, MD  metoprolol tartrate (LOPRESSOR) 50 MG tablet Take 50 mg by mouth daily. 04/20/22   [provider]  omeprazole (PRILOSEC) 40 MG capsule Take 1 capsule (40 mg total) by mouth 2 (two) times daily. 01/25/22   Jaynee Eagles, PA-C   ondansetron (ZOFRAN-ODT) 4 MG disintegrating tablet Take 1 tablet (4 mg total) by mouth every 8 (eight) hours as needed. Patient taking differently: Take 4 mg by mouth every 8 (eight) hours as needed for nausea or vomiting. 05/28/22   Long, Wonda Olds, MD  senna (SENOKOT) 8.6 MG TABS tablet Take 2 tablets (17.2 mg total) by mouth 2 (two) times daily. 06/06/22   Corky Sing, PA-C      Allergies    Patient has no known allergies.    Review of Systems   Review of Systems  Constitutional:  Negative for chills and fever.  Musculoskeletal:  Positive for joint swelling (left elbow).  Skin:  Negative for color change and wound.  Neurological:  Negative for weakness and numbness.    Physical Exam Updated Vital Signs BP 108/62   Pulse 79   Temp 98.1 F (36.7 C) (Oral)   Resp 18   Wt 112 kg   LMP 07/03/2017   SpO2 98%   BMI 39.85 kg/m  Physical Exam Vitals and nursing note reviewed.  Constitutional:      General: She is not in acute distress.    Appearance: Normal appearance. She is not ill-appearing or diaphoretic.  Cardiovascular:     Rate and Rhythm: Normal rate and regular rhythm.  Pulmonary:     Effort: Pulmonary effort is normal.  Musculoskeletal:     Right elbow: Normal. Decreased  range of motion: due to pain.     Left elbow: Swelling and effusion present. Decreased range of motion. Tenderness present in lateral epicondyle and olecranon process.     Comments: Tenderness over the lateral joint Castaneda with palpation.  Minimal tenderness to palpation of olecranon process.  She has increased pain with flexion and extension of the left elbow.  She also has pain with supination and pronation of the hand.  Normal radial pulse.  Small circular area of mild erythema laterally.  No gross erythema around the joint.    Neurological:     Mental Status: She is alert. Mental status is at baseline.  Psychiatric:        Mood and Affect: Mood normal.        Behavior: Behavior normal.      ED Results / Procedures / Treatments   Labs (all labs ordered are listed, but only abnormal results are displayed) Labs Reviewed - No data to display  EKG None  Radiology DG Elbow Complete Left  Result Date: 06/21/2022 CLINICAL DATA:  joint swelling/pain EXAM: LEFT ELBOW - COMPLETE 3+ VIEW COMPARISON:  None Available. FINDINGS: Suspected joint effusion without erosive bony changes. No evidence of acute fracture or joint malalignment. IMPRESSION: Suspected joint effusion without evidence of osteomyelitis. MRI could provide more sensitive evaluation. Electronically Signed   By: Margaretha Sheffield M.D.   On: 06/21/2022 12:47    Procedures Procedures    Medications Ordered in ED Medications  HYDROcodone-acetaminophen (NORCO/VICODIN) 5-325 MG per tablet 1 tablet (1 tablet Oral Given 06/21/22 1245)  ondansetron (ZOFRAN-ODT) disintegrating tablet 4 mg (4 mg Oral Given 06/21/22 1245)    ED Course/ Medical Decision Making/ A&P                             Medical Decision Making Amount and/or Complexity of Data Reviewed Radiology: ordered.  Risk Prescription drug management.   This patient presents to the ED with chief complaint(s) of left elbow swelling with pain and decreased range of motion with pertinent past medical history of HTN, gout, recent I&D of left lower extremity, antibiotic treatment with linezolid.  The complaint involves an extensive differential diagnosis and also carries with it a high risk of complications and morbidity.    The differential diagnosis includes gout flare, septic arthritis, pseudogout, cellulitis, rheumatoid arthritis, tendonitis, bursitis   The initial plan is to obtain x-ray of left elbow to assess joint Castaneda  Additional history obtained: Records reviewed  patient admitted to hospital for abscess in her left lower extremity that was irrigated and debrided surgically along with left knee arthrocentesis; synovial analysis from left knee  demonstrated WBC count of 4800 without crystals   Initial Assessment:   Exam significant for swelling to the left elbow with pain to palpation laterally over the joint Castaneda region.  Very mild tenderness to palpation of the olecranon process.  She has mildly decreased passive ROM due to pain.  Spasms in left forearm appreciated with flexion and extension of the arm.  She has pain with flexion and extension of the elbow as well as pronation and supination of the hand.  There is a small, circular area of mild erythema laterally without obvious wound or rash.  Normal pulses.    Independent visualization and interpretation of imaging: I independently visualized the following imaging with scope of interpretation limited to determining acute life threatening conditions related to emergency care: left elbow x-ray, which revealed  suspected joint effusion without evidence of osteomyelitis.  I agree with radiologist interpretation.    Treatment and Reassessment: Patient's pain was treated with PO hydrocodone, which she reports improved her pain some.  Discussed patient with attending, Leanord Asal, who agreed to evaluate patient independently to determine best course of action.    Low suspicion for septic arthritis.  Do not feel that an aspiration of joint fluid is required at this time.  May be gout vs bursitis.  Will treat with NSAIDs and refer patient back to orthopedics.    Disposition:   The patient has been appropriately medically screened and/or stabilized in the ED. I have low suspicion for any other emergent medical condition which would require further screening, evaluation or treatment in the ED or require inpatient management. At time of discharge the patient is hemodynamically stable and in no acute distress. I have discussed work-up results and diagnosis with patient and answered all questions. Patient is agreeable with discharge plan. We discussed strict return precautions for returning to  the emergency department and they verbalized understanding.            Final Clinical Impression(s) / ED Diagnoses Final diagnoses:  Pain and swelling of left elbow    Rx / DC Orders ED Discharge Orders          Ordered    indomethacin (INDOCIN) 25 MG capsule  3 times daily with meals        06/21/22 1524              Majesty Stehlin, [place] R, [place] 06/21/22 1526    Kemper Durie, DO 06/21/22 1631

## 2022-06-22 ENCOUNTER — Other Ambulatory Visit: Payer: Self-pay

## 2022-06-22 ENCOUNTER — Ambulatory Visit (INDEPENDENT_AMBULATORY_CARE_PROVIDER_SITE_OTHER): Payer: Medicaid Other | Admitting: Internal Medicine

## 2022-06-22 ENCOUNTER — Encounter: Payer: Self-pay | Admitting: Internal Medicine

## 2022-06-22 VITALS — BP 115/79 | HR 103 | Resp 16 | Ht 66.0 in | Wt 246.0 lb

## 2022-06-22 DIAGNOSIS — Z5181 Encounter for therapeutic drug level monitoring: Secondary | ICD-10-CM | POA: Diagnosis not present

## 2022-06-22 DIAGNOSIS — M25422 Effusion, left elbow: Secondary | ICD-10-CM

## 2022-06-22 DIAGNOSIS — L02416 Cutaneous abscess of left lower limb: Secondary | ICD-10-CM | POA: Diagnosis present

## 2022-06-22 NOTE — Assessment & Plan Note (Signed)
Resolved now and no further antibiotics indicated.  Stitiches removed.  Follow up as needed

## 2022-06-22 NOTE — Assessment & Plan Note (Signed)
New issue.  Mild warmth and effusion noted on xray.  I agree, inflammatory issue likely and will try the NSAID and follow up with orthopedics this week.

## 2022-06-22 NOTE — Progress Notes (Signed)
   Subjective:    Patient ID: Connie Castaneda, female    DOB: 30-Mar-1971, 52 y.o.   MRN: [id number]  HPI Shenea is here for follow up from her recent hospitalization. She was hospitalized for left calf abscess and underwent debridement by Dr. Doran Durand.  Cultures grew MRSA and she was discharged with linezolid for 2 weeks, which she is still on.  She was seen in the ED yesterday for elbow swelling with no concerns noted.  Has follow up with orthopedics this Friday and has indomethicin.     Review of Systems  Constitutional:  Negative for fatigue and fever.  Gastrointestinal:  Negative for diarrhea.  Skin:  Negative for rash.       Objective:   Physical Exam Eyes:     General: No scleral icterus. Pulmonary:     Effort: Pulmonary effort is normal.  Musculoskeletal:     Comments: Mild warmth of joint, no erythema, good rom though with pain.    Neurological:     Mental Status: She is alert.   SH: no tobacco        Assessment & Plan:

## 2022-06-22 NOTE — Assessment & Plan Note (Signed)
Will check cmp, cbc

## 2022-06-23 LAB — CBC WITH DIFFERENTIAL/PLATELET
Absolute Monocytes: 464 cells/uL (ref 200–950)
Basophils Absolute: 31 cells/uL (ref 0–200)
Basophils Relative: 0.6 %
Eosinophils Absolute: 31 cells/uL (ref 15–500)
Eosinophils Relative: 0.6 %
HCT: 33 % — ABNORMAL LOW (ref 35.0–45.0)
Hemoglobin: 10.7 g/dL — ABNORMAL LOW (ref 11.7–15.5)
Lymphs Abs: 1724 cells/uL (ref 850–3900)
MCH: 27.5 pg (ref 27.0–33.0)
MCHC: 32.4 g/dL (ref 32.0–36.0)
MCV: 84.8 fL (ref 80.0–100.0)
MPV: 10.6 fL (ref 7.5–12.5)
Monocytes Relative: 9.1 %
Neutro Abs: 2851 cells/uL (ref 1500–7800)
Neutrophils Relative %: 55.9 %
Platelets: 211 10*3/uL (ref 140–400)
RBC: 3.89 10*6/uL (ref 3.80–5.10)
RDW: 12.9 % (ref 11.0–15.0)
Total Lymphocyte: 33.8 %
WBC: 5.1 10*3/uL (ref 3.8–10.8)

## 2022-06-23 LAB — COMPLETE METABOLIC PANEL WITH GFR
AG Ratio: 0.8 (calc) — ABNORMAL LOW (ref 1.0–2.5)
ALT: 15 U/L (ref 6–29)
AST: 13 U/L (ref 10–35)
Albumin: 3.4 g/dL — ABNORMAL LOW (ref 3.6–5.1)
Alkaline phosphatase (APISO): 71 U/L (ref 37–153)
BUN: 11 mg/dL (ref 7–25)
CO2: 26 mmol/L (ref 20–32)
Calcium: 8.8 mg/dL (ref 8.6–10.4)
Chloride: 100 mmol/L (ref 98–110)
Creat: 0.65 mg/dL (ref 0.50–1.03)
Globulin: 4.1 g/dL (calc) — ABNORMAL HIGH (ref 1.9–3.7)
Glucose, Bld: 106 mg/dL — ABNORMAL HIGH (ref 65–99)
Potassium: 3.6 mmol/L (ref 3.5–5.3)
Sodium: 139 mmol/L (ref 135–146)
Total Bilirubin: 0.6 mg/dL (ref 0.2–1.2)
Total Protein: 7.5 g/dL (ref 6.1–8.1)
eGFR: 107 mL/min/{1.73_m2} (ref >=60)

## 2022-08-12 ENCOUNTER — Other Ambulatory Visit: Payer: Self-pay

## 2022-08-12 ENCOUNTER — Encounter (HOSPITAL_COMMUNITY): Payer: Self-pay

## 2022-08-12 ENCOUNTER — Emergency Department (HOSPITAL_COMMUNITY)
Admission: EM | Admit: 2022-08-12 | Discharge: 2022-08-12 | Disposition: A | Discharge status: Home / Self Care | Payer: Medicaid Other | Attending: Emergency Medicine | Admitting: Emergency Medicine

## 2022-08-12 ENCOUNTER — Ambulatory Visit (INDEPENDENT_AMBULATORY_CARE_PROVIDER_SITE_OTHER): Payer: Medicaid Other

## 2022-08-12 ENCOUNTER — Ambulatory Visit (HOSPITAL_COMMUNITY)
Admission: RE | Admit: 2022-08-12 | Discharge: 2022-08-12 | Disposition: A | Discharge status: Home / Self Care | Payer: Medicaid Other | Source: Ambulatory Visit | Attending: Family Medicine | Admitting: Family Medicine

## 2022-08-12 VITALS — BP 137/81 | HR 78 | Temp 97.9°F | Resp 16

## 2022-08-12 DIAGNOSIS — R079 Chest pain, unspecified: Secondary | ICD-10-CM | POA: Diagnosis present

## 2022-08-12 DIAGNOSIS — R0789 Other chest pain: Secondary | ICD-10-CM

## 2022-08-12 MED ORDER — TRAMADOL HCL 50 MG PO TABS: 50.0000 mg | ORAL_TABLET | Freq: Four times a day (QID) | ORAL | 0 refills | Status: DC | PRN

## 2022-08-12 MED ORDER — METHOCARBAMOL 500 MG PO TABS: 500.0000 mg | ORAL_TABLET | Freq: Two times a day (BID) | ORAL | 0 refills | Status: DC

## 2022-08-12 MED ORDER — PREDNISONE 10 MG (21) PO TBPK: ORAL_TABLET | Freq: Every day | ORAL | 0 refills | Status: DC

## 2022-08-12 MED ORDER — KETOROLAC TROMETHAMINE 30 MG/ML IJ SOLN
30.0000 mg | Freq: Once | INTRAMUSCULAR | Status: AC
  Administered 2022-08-12: 30 mg via INTRAMUSCULAR

## 2022-08-12 MED ORDER — KETOROLAC TROMETHAMINE 30 MG/ML IJ SOLN
INTRAMUSCULAR | Status: AC
  Filled 2022-08-12: qty 1

## 2022-08-12 NOTE — Discharge Instructions (Signed)
The x-rays did not show any problem.  Take tramadol 50 mg-- 1 tablet every 6 hours as needed for pain.  This medication can make you sleepy or dizzy  You have been given a shot of Toradol 30 mg today.  I am glad you are getting to follow-up with your primary care next week

## 2022-08-12 NOTE — ED Triage Notes (Signed)
Patient c/o pain under her right breast that is worsening x 4 months.  Patient states she has been taking Tylenol and the alst dose was yesterday.

## 2022-08-12 NOTE — ED Notes (Signed)
Dr. Freida Busman at bedside with this RN doing exam

## 2022-08-12 NOTE — ED Provider Notes (Signed)
[facility]    CSN: [id number] Arrival date & time: 08/12/22  1016      History   Chief Complaint Chief Complaint  Patient presents with   Breast Problem    I am having real bad pain right under my breast - Entered by patient   Breast Pain    HPI Connie Castaneda is a 52 y.o. female.   HPI Here for right anterior chest pain.  Is been bothering her for about 3 months.  For the most part it would only bother her when she would arise from a sitting position or move her arm or twist the torso.  Yesterday it was worse and was more constant.  It also lately has been bothering her when she is lying down.  It is not exertional.  She has not had any rash and she has not had any fever or chills or cough or congestion.  Last mammogram was about 2 years ago, and her primary is going to be setting that up.  She states that she will see her primary care on May 16.  She has had a gastric sleeve and so is not supposed to take any anti-inflammatories.  Tylenol has not been helping  Past Medical History:  Diagnosis Date   GERD (gastroesophageal reflux disease)    Hypertension     Patient Active Problem List   Diagnosis Date Noted   Effusion of left elbow 06/22/2022   Medication monitoring encounter 06/22/2022   Abscess of left lower extremity 06/04/2022    Past Surgical History:  Procedure Laterality Date   CESAREAN SECTION     CHOLECYSTECTOMY     FOOT SURGERY     I & D EXTREMITY Left 06/04/2022   Procedure: IRRIGATION AND DEBRIDEMENT EXTREMITY LEFT CALF ABSCESS, LEFT KNEE ARTHROCENTESIS;  Surgeon: Toni Arthurs, MD;  Location: [facility];  Service: Orthopedics;  Laterality: Left;   LAPAROSCOPIC GASTRIC SLEEVE RESECTION      OB History   No obstetric history on file.      Home Medications    Prior to Admission medications   Medication Sig Start Date End Date Taking? Authorizing Provider  traMADol (ULTRAM) 50 MG tablet Take 1 tablet (50 mg total) by mouth every 6 (six)  hours as needed (pain). 08/12/22  Yes Zenia Resides, MD  colchicine 0.6 MG tablet Take 0.6 mg by mouth daily. 05/13/22   [provider]  docusate sodium (COLACE) 100 MG capsule Take 1 capsule (100 mg total) by mouth 2 (two) times daily. While taking narcotic pain medicine. 06/06/22   Jacinta Shoe, PA-C  metoprolol tartrate (LOPRESSOR) 50 MG tablet Take 50 mg by mouth daily. 04/20/22   [provider]  omeprazole (PRILOSEC) 40 MG capsule Take 1 capsule (40 mg total) by mouth 2 (two) times daily. 01/25/22   Wallis Bamberg, PA-C  ondansetron (ZOFRAN-ODT) 4 MG disintegrating tablet Take 1 tablet (4 mg total) by mouth every 8 (eight) hours as needed. Patient taking differently: Take 4 mg by mouth every 8 (eight) hours as needed for nausea or vomiting. 05/28/22   Long, Arlyss Repress, MD    Family History Family History  Problem Relation Age of Onset   Cancer Mother    Hypertension Mother    Hypertension Father    Gout Father     Social History Social History   Tobacco Use   Smoking status: Never    Passive exposure: Never   Smokeless tobacco: Never  Vaping Use  Vaping Use: Never used  Substance Use Topics   Alcohol use: No   Drug use: No     Allergies   Patient has no known allergies.   Review of Systems Review of Systems   Physical Exam Triage Vital Signs ED Triage Vitals  Enc Vitals Group     BP 08/12/22 1032 137/81     Pulse Rate 08/12/22 1032 78     Resp 08/12/22 1032 16     Temp 08/12/22 1032 97.9 F (36.6 C)     Temp Source 08/12/22 1032 Oral     SpO2 08/12/22 1032 95 %     Weight --      Height --      Head Circumference --      Peak Flow --      Pain Score 08/12/22 1033 10     Pain Loc --      Pain Edu? --      Excl. in GC? --    No data found.  Updated Vital Signs BP 137/81 (BP Location: Left Arm)   Pulse 78   Temp 97.9 F (36.6 C) (Oral)   Resp 16   LMP 07/03/2017   SpO2 95%   Visual Acuity Right Eye Distance:   Left Eye  Distance:   Bilateral Distance:    Right Eye Near:   Left Eye Near:    Bilateral Near:     Physical Exam Vitals reviewed.  Constitutional:      General: She is not in acute distress.    Appearance: She is not ill-appearing, toxic-appearing or diaphoretic.  HENT:     Mouth/Throat:     Mouth: Mucous membranes are moist.  Eyes:     Extraocular Movements: Extraocular movements intact.     Conjunctiva/sclera: Conjunctivae normal.     Pupils: Pupils are equal, round, and reactive to light.  Cardiovascular:     Rate and Rhythm: Normal rate and regular rhythm.     Heart sounds: No murmur heard. Pulmonary:     Effort: Pulmonary effort is normal.     Breath sounds: Normal breath sounds.  Chest:     Chest wall: Tenderness (Under right breast in the midclavicular line.) present.  Musculoskeletal:     Cervical back: Neck supple.  Lymphadenopathy:     Cervical: No cervical adenopathy.  Skin:    Coloration: Skin is not jaundiced or pale.  Neurological:     General: No focal deficit present.     Mental Status: She is alert and oriented to person, place, and time.  Psychiatric:        Behavior: Behavior normal.      UC Treatments / Results  Labs (all labs ordered are listed, but only abnormal results are displayed) Labs Reviewed - No data to display  EKG   Radiology DG Ribs Unilateral Right  Result Date: 08/12/2022 CLINICAL DATA:  Four-month history of worsening right anterior chest pain under the right breast EXAM: RIGHT RIBS - 2 VIEW; CHEST - 2 VIEW COMPARISON:  Chest radiograph dated 05/25/2022 FINDINGS: Normal lung volumes. No focal consolidations. No pleural effusion or pneumothorax. The heart size and mediastinal contours are within normal limits. No acute osseous abnormality. No fracture or other bone lesions are seen involving the ribs. Right upper quadrant surgical clips. IMPRESSION: 1. No acute cardiopulmonary process. 2. No acute displaced rib fracture. Electronically  Signed   By: Agustin Cree M.D.   On: 08/12/2022 11:16   DG Chest 2 View  Result Date: 08/12/2022 CLINICAL DATA:  Four-month history of worsening right anterior chest pain under the right breast EXAM: RIGHT RIBS - 2 VIEW; CHEST - 2 VIEW COMPARISON:  Chest radiograph dated 05/25/2022 FINDINGS: Normal lung volumes. No focal consolidations. No pleural effusion or pneumothorax. The heart size and mediastinal contours are within normal limits. No acute osseous abnormality. No fracture or other bone lesions are seen involving the ribs. Right upper quadrant surgical clips. IMPRESSION: 1. No acute cardiopulmonary process. 2. No acute displaced rib fracture. Electronically Signed   By: Agustin Cree M.D.   On: 08/12/2022 11:16    Procedures Procedures (including critical care time)  Medications Ordered in UC Medications  ketorolac (TORADOL) 30 MG/ML injection 30 mg (has no administration in time range)    Initial Impression / Assessment and Plan / UC Course  I have reviewed the triage vital signs and the nursing notes.  Pertinent labs & imaging results that were available during my care of the patient were reviewed by me and considered in my medical decision making (see chart for details).       Since her pain was worse and more persistent we decided to repeat her imaging with a full 2 view chest and right rib detail. There is no acute abnormality on her chest x-ray or rib x-rays.   Toradol is given here for pain.  Small amount of tramadol was sent in for pain relief.  She will follow-up with her primary care next week. Final Clinical Impressions(s) / UC Diagnoses   Final diagnoses:  Chest wall pain     Discharge Instructions      The x-rays did not show any problem.  Take tramadol 50 mg-- 1 tablet every 6 hours as needed for pain.  This medication can make you sleepy or dizzy  You have been given a shot of Toradol 30 mg today.  I am glad you are getting to follow-up with your primary care  next week     ED Prescriptions     Medication Sig Dispense Auth. Provider   traMADol (ULTRAM) 50 MG tablet Take 1 tablet (50 mg total) by mouth every 6 (six) hours as needed (pain). 12 tablet Zenita Kister, Janace Aris, MD      I have reviewed the PDMP during this encounter.   Zenia Resides, MD 08/12/22 1125

## 2022-08-12 NOTE — ED Provider Notes (Signed)
[facility] Provider Note   CSN: [id number] Arrival date & time: 08/12/22  1845     History  Chief Complaint  Patient presents with   Chest Pain    Connie Castaneda is a 52 y.o. female.  52 year old female presents with several months of right-sided chest pain.  Pain has been worse with palpation and movement.  Denies any dyspnea.  No leg component to it.  Was seen in urgent care today and that visit was reviewed.  Negative chest x-ray per the report.  Was given shot of Toradol and it felt better initially but pain is come back.  Denies any rashes.       Home Medications Prior to Admission medications   Medication Sig Start Date End Date Taking? Authorizing Provider  colchicine 0.6 MG tablet Take 0.6 mg by mouth daily. 05/13/22   [provider]  docusate sodium (COLACE) 100 MG capsule Take 1 capsule (100 mg total) by mouth 2 (two) times daily. While taking narcotic pain medicine. 06/06/22   Jacinta Shoe, PA-C  metoprolol tartrate (LOPRESSOR) 50 MG tablet Take 50 mg by mouth daily. 04/20/22   [provider]  omeprazole (PRILOSEC) 40 MG capsule Take 1 capsule (40 mg total) by mouth 2 (two) times daily. 01/25/22   Wallis Bamberg, PA-C  ondansetron (ZOFRAN-ODT) 4 MG disintegrating tablet Take 1 tablet (4 mg total) by mouth every 8 (eight) hours as needed. Patient taking differently: Take 4 mg by mouth every 8 (eight) hours as needed for nausea or vomiting. 05/28/22   Long, Arlyss Repress, MD  traMADol (ULTRAM) 50 MG tablet Take 1 tablet (50 mg total) by mouth every 6 (six) hours as needed (pain). 08/12/22   Zenia Resides, MD      Allergies    Patient has no known allergies.    Review of Systems   Review of Systems  All other systems reviewed and are negative.   Physical Exam Updated Vital Signs BP 134/66   Pulse 66   Temp 98 F (36.7 C) (Oral)   Resp 13   Ht 1.676 m (5\' 6" )   Wt 111.6 kg   LMP 07/03/2017   SpO2  100%   BMI 39.71 kg/m  Physical Exam Vitals and nursing note reviewed. Exam conducted with a chaperone present.  Constitutional:      General: She is not in acute distress.    Appearance: Normal appearance. She is well-developed. She is not toxic-appearing.  HENT:     Head: Normocephalic and atraumatic.  Eyes:     General: Lids are normal.     Conjunctiva/sclera: Conjunctivae normal.     Pupils: Pupils are equal, round, and reactive to light.  Neck:     Thyroid: No thyroid mass.     Trachea: No tracheal deviation.  Cardiovascular:     Rate and Rhythm: Normal rate and regular rhythm.     Heart sounds: Normal heart sounds. No murmur heard.    No gallop.  Pulmonary:     Effort: Pulmonary effort is normal. No respiratory distress.     Breath sounds: Normal breath sounds. No stridor. No decreased breath sounds, wheezing, rhonchi or rales.  Chest:    Abdominal:     General: There is no distension.     Palpations: Abdomen is soft.     Tenderness: There is no abdominal tenderness. There is no rebound.  Musculoskeletal:        General: No tenderness.  Normal range of motion.     Cervical back: Normal range of motion and neck supple.  Skin:    General: Skin is warm and dry.     Findings: No abrasion or rash.  Neurological:     Mental Status: She is alert and oriented to person, place, and time. Mental status is at baseline.     GCS: GCS eye subscore is 4. GCS verbal subscore is 5. GCS motor subscore is 6.     Cranial Nerves: No cranial nerve deficit.     Sensory: No sensory deficit.     Motor: Motor function is intact.  Psychiatric:        Attention and Perception: Attention normal.        Speech: Speech normal.        Behavior: Behavior normal.     ED Results / Procedures / Treatments   Labs (all labs ordered are listed, but only abnormal results are displayed) Labs Reviewed - No data to display  EKG EKG Interpretation  Date/Time:  Friday Aug 12 2022 19:06:50  EDT Ventricular Rate:  75 PR Interval:  136 QRS Duration: 91 QT Interval:  395 QTC Calculation: 442 R Axis:   25 Text Interpretation: Sinus rhythm Confirmed by Lorre Nick ([id number]) on 08/12/2022 8:16:28 PM  Radiology DG Ribs Unilateral Right  Result Date: 08/12/2022 CLINICAL DATA:  Four-month history of worsening right anterior chest pain under the right breast EXAM: RIGHT RIBS - 2 VIEW; CHEST - 2 VIEW COMPARISON:  Chest radiograph dated 05/25/2022 FINDINGS: Normal lung volumes. No focal consolidations. No pleural effusion or pneumothorax. The heart size and mediastinal contours are within normal limits. No acute osseous abnormality. No fracture or other bone lesions are seen involving the ribs. Right upper quadrant surgical clips. IMPRESSION: 1. No acute cardiopulmonary process. 2. No acute displaced rib fracture. Electronically Signed   By: Agustin Cree M.D.   On: 08/12/2022 11:16   DG Chest 2 View  Result Date: 08/12/2022 CLINICAL DATA:  Four-month history of worsening right anterior chest pain under the right breast EXAM: RIGHT RIBS - 2 VIEW; CHEST - 2 VIEW COMPARISON:  Chest radiograph dated 05/25/2022 FINDINGS: Normal lung volumes. No focal consolidations. No pleural effusion or pneumothorax. The heart size and mediastinal contours are within normal limits. No acute osseous abnormality. No fracture or other bone lesions are seen involving the ribs. Right upper quadrant surgical clips. IMPRESSION: 1. No acute cardiopulmonary process. 2. No acute displaced rib fracture. Electronically Signed   By: Agustin Cree M.D.   On: 08/12/2022 11:16    Procedures Procedures    Medications Ordered in ED Medications - No data to display  ED Course/ Medical Decision Making/ A&P                             Medical Decision Making  Patient is EKG per interpretation shows normal sinus rhythm.  Patient recently just had x-ray of her chest and the reason to repeat this.  She has no crepitus noted.  No  rashes noted.  No evidence of zoster at this time.  Pain is treated for several months.  States she has been diagnosed with costochondritis in the past.  Suspect this patient has a stye.  Will place patient on muscle relaxants and prednisone taper.  Patient to see her doctor in a week        Final Clinical Impression(s) / ED Diagnoses Final diagnoses:  None    Rx / DC Orders ED Discharge Orders     None         Lorre Nick, MD 08/12/22 2018

## 2022-08-12 NOTE — ED Triage Notes (Signed)
Pt. Arrives POV c/o pain underneath her R. Breast for "a couple months." Pt. States that her pain has gotten worse since yesterday. Pt. States that she was seen at urgent care earlier and received a shot of toradol, but she has not had any relief. She also took tramadol at 1540 and is still in pain.

## 2023-02-13 ENCOUNTER — Other Ambulatory Visit (HOSPITAL_COMMUNITY): Payer: Self-pay

## 2023-02-13 MED ORDER — WEGOVY 0.25 MG/0.5ML ~~LOC~~ SOAJ
SUBCUTANEOUS | 0 refills | Status: DC
  Filled 2023-02-13: qty 2, 28d supply, fill #0

## 2023-02-27 ENCOUNTER — Ambulatory Visit
Admission: RE | Admit: 2023-02-27 | Discharge: 2023-02-27 | Disposition: A | Discharge status: Home / Self Care | Payer: Medicaid Other | Source: Ambulatory Visit | Attending: Emergency Medicine | Admitting: Emergency Medicine

## 2023-02-27 ENCOUNTER — Ambulatory Visit (INDEPENDENT_AMBULATORY_CARE_PROVIDER_SITE_OTHER): Payer: Medicaid Other

## 2023-02-27 VITALS — BP 119/70 | HR 98 | Temp 97.8°F | Resp 16

## 2023-02-27 DIAGNOSIS — J069 Acute upper respiratory infection, unspecified: Secondary | ICD-10-CM | POA: Diagnosis not present

## 2023-02-27 DIAGNOSIS — J398 Other specified diseases of upper respiratory tract: Secondary | ICD-10-CM | POA: Diagnosis not present

## 2023-02-27 DIAGNOSIS — R059 Cough, unspecified: Secondary | ICD-10-CM

## 2023-02-27 MED ORDER — BENZONATATE 100 MG PO CAPS: 100.0000 mg | ORAL_CAPSULE | Freq: Three times a day (TID) | ORAL | 0 refills | Status: DC | PRN

## 2023-02-27 MED ORDER — PREDNISONE 20 MG PO TABS: 40.0000 mg | ORAL_TABLET | Freq: Every day | ORAL | 0 refills | Status: AC

## 2023-02-27 MED ORDER — GUAIFENESIN ER 600 MG PO TB12: 600.0000 mg | ORAL_TABLET | Freq: Two times a day (BID) | ORAL | 0 refills | Status: AC

## 2023-02-27 NOTE — ED Provider Notes (Signed)
[facility]    CSN: [id number] Arrival date & time: 02/27/23  2536     History   Chief Complaint Chief Complaint  Patient presents with   Cough    Coughing real bad since Wednesday. From coughing it has my inside hurting real bad. - Entered by patient    HPI Connie Castaneda is a 52 y.o. female.  5 day history of cough, persistently worsening  Reporting discomfort with cough, feels chest congestion No shortness of breath No issues with sinuses Denies fever or chills Has tried OTC medications with no relief   Hx HTN  Past Medical History:  Diagnosis Date   GERD (gastroesophageal reflux disease)    Hypertension     Patient Active Problem List   Diagnosis Date Noted   Effusion of left elbow 06/22/2022   Medication monitoring encounter 06/22/2022   Abscess of left lower extremity 06/04/2022    Past Surgical History:  Procedure Laterality Date   CESAREAN SECTION     CHOLECYSTECTOMY     FOOT SURGERY     I & D EXTREMITY Left 06/04/2022   Procedure: IRRIGATION AND DEBRIDEMENT EXTREMITY LEFT CALF ABSCESS, LEFT KNEE ARTHROCENTESIS;  Surgeon: Toni Arthurs, MD;  Location: [facility];  Service: Orthopedics;  Laterality: Left;   LAPAROSCOPIC GASTRIC SLEEVE RESECTION      OB History   No obstetric history on file.      Home Medications    Prior to Admission medications   Medication Sig Start Date End Date Taking? Authorizing Provider  benzonatate (TESSALON) 100 MG capsule Take 1 capsule (100 mg total) by mouth 3 (three) times daily as needed for cough. 02/27/23  Yes Quantia Grullon, Lurena Joiner, PA-C  guaiFENesin (MUCINEX) 600 MG 12 hr tablet Take 1 tablet (600 mg total) by mouth 2 (two) times daily for 5 days. 02/27/23 03/04/23 Yes Yoan Sallade, Lurena Joiner, PA-C  predniSONE (DELTASONE) 20 MG tablet Take 2 tablets (40 mg total) by mouth daily with breakfast for 5 days. 02/27/23 03/04/23 Yes Bernhard Koskinen, Lurena Joiner, PA-C  colchicine 0.6 MG tablet Take 0.6 mg by mouth daily. 05/13/22   [provider]  docusate sodium (COLACE) 100 MG capsule Take 1 capsule (100 mg total) by mouth 2 (two) times daily. While taking narcotic pain medicine. 06/06/22   Jacinta Shoe, PA-C  methocarbamol (ROBAXIN) 500 MG tablet Take 1 tablet (500 mg total) by mouth 2 (two) times daily. 08/12/22   Lorre Nick, MD  metoprolol tartrate (LOPRESSOR) 50 MG tablet Take 50 mg by mouth daily. 04/20/22   [provider]  omeprazole (PRILOSEC) 40 MG capsule Take 1 capsule (40 mg total) by mouth 2 (two) times daily. 01/25/22   Wallis Bamberg, PA-C  ondansetron (ZOFRAN-ODT) 4 MG disintegrating tablet Take 1 tablet (4 mg total) by mouth every 8 (eight) hours as needed. Patient taking differently: Take 4 mg by mouth every 8 (eight) hours as needed for nausea or vomiting. 05/28/22   Long, Arlyss Repress, MD  Semaglutide-Weight Management (WEGOVY) 0.25 MG/0.5ML SOAJ Inject 0.5 mL (0.25 mg total) under the skin every 7 days. 02/13/23       Family History Family History  Problem Relation Age of Onset   Cancer Mother    Hypertension Mother    Hypertension Father    Gout Father     Social History Social History   Tobacco Use   Smoking status: Never    Passive exposure: Never   Smokeless tobacco: Never  Vaping Use   Vaping status: Never Used  Substance Use Topics   Alcohol use: No   Drug use: No     Allergies   Patient has no known allergies.   Review of Systems Review of Systems  Respiratory:  Positive for cough.    Per HPI  Physical Exam Triage Vital Signs ED Triage Vitals  Encounter Vitals Group     BP      Systolic BP Percentile      Diastolic BP Percentile      Pulse      Resp      Temp      Temp src      SpO2      Weight      Height      Head Circumference      Peak Flow      Pain Score      Pain Loc      Pain Education      Exclude from Growth Chart    No data found.  Updated Vital Signs BP 119/70   Pulse 98   Temp 97.8 F (36.6 C) (Oral)   Resp 16   LMP  07/03/2017   SpO2 98%    Physical Exam Vitals and nursing note reviewed.  Constitutional:      General: She is not in acute distress.    Appearance: She is not ill-appearing.  HENT:     Nose: No rhinorrhea.     Mouth/Throat:     Mouth: Mucous membranes are moist.     Pharynx: Oropharynx is clear. No posterior oropharyngeal erythema.  Eyes:     Conjunctiva/sclera: Conjunctivae normal.  Cardiovascular:     Rate and Rhythm: Normal rate and regular rhythm.     Pulses: Normal pulses.     Heart sounds: Normal heart sounds.  Pulmonary:     Effort: Pulmonary effort is normal. No tachypnea.     Breath sounds: Wheezing and rales present.     Comments: Rales in left mid-lower lobe. Faint wheeze upper lobes Musculoskeletal:     Cervical back: Normal range of motion.  Lymphadenopathy:     Cervical: No cervical adenopathy.  Skin:    General: Skin is warm and dry.  Neurological:     Mental Status: She is alert and oriented to person, place, and time.     UC Treatments / Results  Labs (all labs ordered are listed, but only abnormal results are displayed) Labs Reviewed - No data to display  EKG  Radiology DG Chest 2 View  Result Date: 02/27/2023 CLINICAL DATA:  Cough for 5 days.  Abnormal breath sounds. EXAM: CHEST - 2 VIEW COMPARISON:  08/12/2022 FINDINGS: The heart size and mediastinal contours are within normal limits. Both lungs are clear. The visualized skeletal structures are unremarkable. IMPRESSION: No active cardiopulmonary disease. Electronically Signed   By: Danae Orleans M.D.   On: 02/27/2023 09:35    Procedures Procedures (including critical care time)  Medications Ordered in UC Medications - No data to display  Initial Impression / Assessment and Plan / UC Course  I have reviewed the triage vital signs and the nursing notes.  Pertinent labs & imaging results that were available during my care of the patient were reviewed by me and considered in my medical decision  making (see chart for details).  Afebrile, sating 98% on room air Rales heard on exam Chest xray is negative.  Prednisone 40 mg daily x 5 Tessalon TID prn. Also advised mucinex for chest congestion, increased fluids  Return and ED precautions discussed. Patient agrees to plan  Final Clinical Impressions(s) / UC Diagnoses   Final diagnoses:  Viral URI with cough     Discharge Instructions      The tessalon cough pills can be taken 3x daily. If this medication makes you drowsy, take only one pill before bed.  Please take the prednisone 2 tablets daily, for 5 days in a row  I also recommend using mucinex for chest congestion (with lots of fluids!)  Please return if needed     ED Prescriptions     Medication Sig Dispense Auth. Provider   benzonatate (TESSALON) 100 MG capsule Take 1 capsule (100 mg total) by mouth 3 (three) times daily as needed for cough. 30 capsule Kylin Dubs, PA-C   guaiFENesin (MUCINEX) 600 MG 12 hr tablet Take 1 tablet (600 mg total) by mouth 2 (two) times daily for 5 days. 10 tablet Eliott Amparan, PA-C   predniSONE (DELTASONE) 20 MG tablet Take 2 tablets (40 mg total) by mouth daily with breakfast for 5 days. 10 tablet Zipporah Finamore, Lurena Joiner, PA-C      PDMP not reviewed this encounter.   Marlow Baars, [place] 02/27/23 [phone]

## 2023-02-27 NOTE — ED Triage Notes (Signed)
Pt presents to [facility] with cough since Wednesday, nausea and lightheaded this morning. OTC cough medicine and cough drops with very little help.

## 2023-02-27 NOTE — Discharge Instructions (Addendum)
The tessalon cough pills can be taken 3x daily. If this medication makes you drowsy, take only one pill before bed.  Please take the prednisone 2 tablets daily, for 5 days in a row  I also recommend using mucinex for chest congestion (with lots of fluids!)  Please return if needed

## 2023-03-10 ENCOUNTER — Other Ambulatory Visit (HOSPITAL_COMMUNITY): Payer: Self-pay

## 2023-03-10 MED ORDER — WEGOVY 0.5 MG/0.5ML ~~LOC~~ SOAJ
0.5000 mg | SUBCUTANEOUS | 0 refills | Status: DC
  Filled 2023-03-10: qty 2, 28d supply, fill #0

## 2023-03-13 ENCOUNTER — Other Ambulatory Visit (HOSPITAL_COMMUNITY): Payer: Self-pay

## 2023-03-15 ENCOUNTER — Encounter: Payer: Medicaid Other | Admitting: Internal Medicine

## 2023-03-15 ENCOUNTER — Other Ambulatory Visit (HOSPITAL_COMMUNITY): Payer: Self-pay

## 2023-04-07 ENCOUNTER — Other Ambulatory Visit (HOSPITAL_COMMUNITY): Payer: Self-pay

## 2023-04-07 MED ORDER — WEGOVY 1 MG/0.5ML ~~LOC~~ SOAJ
1.0000 mg | SUBCUTANEOUS | 0 refills | Status: DC
  Filled 2023-04-07 – 2023-05-04 (×2): qty 2, 28d supply, fill #0

## 2023-04-17 ENCOUNTER — Other Ambulatory Visit (HOSPITAL_COMMUNITY): Payer: Self-pay

## 2023-04-21 ENCOUNTER — Other Ambulatory Visit (HOSPITAL_COMMUNITY): Payer: Self-pay

## 2023-05-04 ENCOUNTER — Other Ambulatory Visit (HOSPITAL_COMMUNITY): Payer: Self-pay

## 2023-05-26 ENCOUNTER — Other Ambulatory Visit (HOSPITAL_COMMUNITY): Payer: Self-pay

## 2023-05-26 MED ORDER — WEGOVY 1.7 MG/0.75ML ~~LOC~~ SOAJ
1.7000 mg | SUBCUTANEOUS | 0 refills | Status: DC
  Filled 2023-05-26 – 2023-06-02 (×3): qty 3, 28d supply, fill #0

## 2023-05-30 ENCOUNTER — Other Ambulatory Visit (HOSPITAL_COMMUNITY): Payer: Self-pay

## 2023-05-31 ENCOUNTER — Other Ambulatory Visit (HOSPITAL_COMMUNITY): Payer: Self-pay

## 2023-06-02 ENCOUNTER — Other Ambulatory Visit (HOSPITAL_COMMUNITY): Payer: Self-pay

## 2023-06-12 ENCOUNTER — Other Ambulatory Visit (HOSPITAL_COMMUNITY): Payer: Self-pay

## 2023-07-05 ENCOUNTER — Other Ambulatory Visit (HOSPITAL_COMMUNITY): Payer: Self-pay

## 2023-07-05 MED ORDER — WEGOVY 2.4 MG/0.75ML ~~LOC~~ SOAJ
2.4000 mg | SUBCUTANEOUS | 2 refills | Status: DC
  Filled 2023-07-05: qty 3, 28d supply, fill #0
  Filled 2023-08-14 – 2023-08-17 (×2): qty 3, 28d supply, fill #1
  Filled 2023-09-15: qty 3, 28d supply, fill #2

## 2023-07-17 ENCOUNTER — Ambulatory Visit
Admission: RE | Admit: 2023-07-17 | Discharge: 2023-07-17 | Disposition: A | Discharge status: Home / Self Care | Source: Ambulatory Visit

## 2023-07-17 VITALS — BP 105/62 | HR 103 | Temp 98.2°F | Resp 18

## 2023-07-17 DIAGNOSIS — U071 COVID-19: Secondary | ICD-10-CM | POA: Diagnosis not present

## 2023-07-17 MED ORDER — BENZONATATE 100 MG PO CAPS: 200.0000 mg | ORAL_CAPSULE | Freq: Three times a day (TID) | ORAL | 0 refills | Status: AC | PRN

## 2023-07-17 MED ORDER — LEVOCETIRIZINE DIHYDROCHLORIDE 5 MG PO TABS: 5.0000 mg | ORAL_TABLET | Freq: Every evening | ORAL | 0 refills | Status: AC

## 2023-07-17 MED ORDER — PREDNISONE 20 MG PO TABS: 20.0000 mg | ORAL_TABLET | Freq: Two times a day (BID) | ORAL | 0 refills | Status: DC

## 2023-07-17 NOTE — ED Provider Notes (Signed)
 [facility]    CSN: [id number] Arrival date & time: 07/17/23  1741      History   Chief Complaint Chief Complaint  Patient presents with   Nasal Congestion    Took covid test and it said positive - Entered by patient    HPI Connie Castaneda is a 53 y.o. female.   HPI Presents today for evaluation of URI symptoms and concern after testing positive for COVID at home.  Symptoms have been present for more than 5 days.  Patient reports if she has had a fever it was over the weekend and has not had any chills or flashes the weekend.  Reports she only took a COVID test as her mom was concerned that she may have COVID.  Denies any shortness of breath, chest tightness or breathlessness.  Patient actually went to work today reports her symptoms have improved but she continues to have the congestion.  Multiple over-the-counter medication without improvement of sinus symptoms.  Past Medical History:  Diagnosis Date   GERD (gastroesophageal reflux disease)    Hypertension     Patient Active Problem List   Diagnosis Date Noted   Morbid obesity with body mass index (BMI) of 40.0 to 44.9 in adult [facility]) 12/06/2022   Effusion of left elbow 06/22/2022   Medication monitoring encounter 06/22/2022   Abscess of left lower extremity 06/04/2022    Past Surgical History:  Procedure Laterality Date   CESAREAN SECTION     CHOLECYSTECTOMY     FOOT SURGERY     I & D EXTREMITY Left 06/04/2022   Procedure: IRRIGATION AND DEBRIDEMENT EXTREMITY LEFT CALF ABSCESS, LEFT KNEE ARTHROCENTESIS;  Surgeon: Amada Backer, MD;  Location: [facility];  Service: Orthopedics;  Laterality: Left;   LAPAROSCOPIC GASTRIC SLEEVE RESECTION      OB History   No obstetric history on file.      Home Medications    Prior to Admission medications   Medication Sig Start Date End Date Taking? Authorizing Provider  acetaminophen (TYLENOL) 325 MG tablet Take 650 mg by mouth every 6 (six) hours as needed.   Yes [provider]  ciprofloxacin (CIPRO) 250 MG tablet Take 250 mg by mouth daily. 06/09/23  Yes [provider]  fluconazole (DIFLUCAN) 100 MG tablet Take 100 mg by mouth daily. 05/04/23  Yes [provider]  triamterene-hydrochlorothiazide (MAXZIDE-25) 37.5-25 MG tablet Take 1 tablet by mouth daily. 10/24/22  Yes [provider]  Vitamin D, Ergocalciferol, (DRISDOL) 1.25 MG (50000 UNIT) CAPS capsule Take by mouth. 04/20/23 04/19/24 Yes [provider]  amLODipine (NORVASC) 2.5 MG tablet Take by mouth.    [provider]  benzonatate (TESSALON) 100 MG capsule Take 1 capsule (100 mg total) by mouth 3 (three) times daily as needed for cough. 02/27/23   Rising, Ivette Marks, PA-C  colchicine 0.6 MG tablet Take 0.6 mg by mouth daily. 05/13/22   [provider]  docusate sodium (COLACE) 100 MG capsule Take 1 capsule (100 mg total) by mouth 2 (two) times daily. While taking narcotic pain medicine. 06/06/22   Debbra Fairy, PA-C  methocarbamol (ROBAXIN) 500 MG tablet Take 1 tablet (500 mg total) by mouth 2 (two) times daily. 08/12/22   Lind Repine, MD  metoprolol tartrate (LOPRESSOR) 50 MG tablet Take 50 mg by mouth daily. 04/20/22   [provider]  omeprazole (PRILOSEC) 40 MG capsule Take 1 capsule (40 mg total) by mouth 2 (two) times daily. 01/25/22   Adolph Hoop,  PA-C  ondansetron (ZOFRAN-ODT) 4 MG disintegrating tablet Take 1 tablet (4 mg total) by mouth every 8 (eight) hours as needed. Patient taking differently: Take 4 mg by mouth every 8 (eight) hours as needed for nausea or vomiting. 05/28/22   Long, Shereen Dike, MD  Semaglutide-Weight Management (WEGOVY) 2.4 MG/0.75ML SOAJ Inject 2.4 mg into the skin once a week. 07/05/23       Family History Family History  Problem Relation Age of Onset   Cancer Mother    Hypertension Mother    Hypertension Father    Gout Father     Social History Social History   Tobacco Use   Smoking status: Never     Passive exposure: Never   Smokeless tobacco: Never  Vaping Use   Vaping status: Never Used  Substance Use Topics   Alcohol use: No   Drug use: No     Allergies   Patient has no known allergies.   Review of Systems Review of Systems Pertinent negatives listed in HPI   Physical Exam Triage Vital Signs ED Triage Vitals  Encounter Vitals Group     BP 07/17/23 1801 105/62     Systolic BP Percentile --      Diastolic BP Percentile --      Pulse Rate 07/17/23 1801 (!) 103     Resp 07/17/23 1801 18     Temp 07/17/23 1801 98.2 F (36.8 C)     Temp src --      SpO2 07/17/23 1801 97 %     Weight --      Height --      Head Circumference --      Peak Flow --      Pain Score 07/17/23 1759 3     Pain Loc --      Pain Education --      Exclude from Growth Chart --    No data found.  Updated Vital Signs BP 105/62 (BP Location: Left Arm)   Pulse (!) 103   Temp 98.2 F (36.8 C)   Resp 18   LMP 07/03/2017   SpO2 97%   Visual Acuity Right Eye Distance:   Left Eye Distance:   Bilateral Distance:    Right Eye Near:   Left Eye Near:    Bilateral Near:     Physical Exam Vitals reviewed.  Constitutional:      Appearance: Normal appearance. She is not ill-appearing.  HENT:     Head: Normocephalic and atraumatic.     Nose: Congestion and rhinorrhea present.     Mouth/Throat:     Pharynx: No oropharyngeal exudate or posterior oropharyngeal erythema.  Eyes:     Extraocular Movements: Extraocular movements intact.     Conjunctiva/sclera: Conjunctivae normal.     Pupils: Pupils are equal, round, and reactive to light.  Cardiovascular:     Rate and Rhythm: Regular rhythm. Tachycardia present.  Pulmonary:     Effort: Pulmonary effort is normal.     Breath sounds: Normal breath sounds.  Musculoskeletal:     Cervical back: Normal range of motion and neck supple.  Lymphadenopathy:     Cervical: No cervical adenopathy.  Skin:    General: Skin is warm and dry.   Neurological:     General: No focal deficit present.     Mental Status: She is alert and oriented to person, place, and time.      UC Treatments / Results  Labs (all labs ordered are listed,  but only abnormal results are displayed) Labs Reviewed  POC SARS CORONAVIRUS 2 AG -  ED    EKG   Radiology No results found.  Procedures Procedures (including critical care time)  Medications Ordered in UC Medications - No data to display  Initial Impression / Assessment and Plan / UC Course  I have reviewed the triage vital signs and the nursing notes.  Pertinent labs & imaging results that were available during my care of the patient were reviewed by me and considered in my medical decision making (see chart for details).   COVID-positive confirmed by antigen test. Symptom management indicated only.  Patient is afebrile and cleared to return back to work.  The management per discharge medication orders.  Return precautions given.  Final diagnoses:  COVID-19 virus infection   Discharge Instructions   None    ED Prescriptions   None    PDMP not reviewed this encounter.   Buena Carmine, NP 07/17/23 [phone]

## 2023-07-17 NOTE — ED Triage Notes (Signed)
 Took covid test and it said positive - Entered by patient   Pt requesting another test in office.

## 2023-08-14 ENCOUNTER — Other Ambulatory Visit (HOSPITAL_BASED_OUTPATIENT_CLINIC_OR_DEPARTMENT_OTHER): Payer: Self-pay

## 2023-08-14 ENCOUNTER — Other Ambulatory Visit (HOSPITAL_COMMUNITY): Payer: Self-pay

## 2023-08-14 ENCOUNTER — Encounter: Payer: Self-pay | Admitting: Internal Medicine

## 2023-08-14 ENCOUNTER — Ambulatory Visit: Payer: Medicaid Other | Attending: Internal Medicine | Admitting: Internal Medicine

## 2023-08-14 VITALS — BP 126/83 | HR 98 | Resp 16 | Ht 66.0 in | Wt 226.0 lb

## 2023-08-14 DIAGNOSIS — R768 Other specified abnormal immunological findings in serum: Secondary | ICD-10-CM | POA: Diagnosis present

## 2023-08-14 DIAGNOSIS — E790 Hyperuricemia without signs of inflammatory arthritis and tophaceous disease: Secondary | ICD-10-CM | POA: Diagnosis present

## 2023-08-14 NOTE — Patient Instructions (Signed)
 Scapular depression and retraction After you have practiced this exercise, try doing it without the arm support. Then, try doing it while standing instead of sitting. Sit on a stable chair. Support your arms in front of you with pillows, armrests, or a tabletop. Keep your elbows near the sides of your body. Gently move your shoulder blades down (scapular depression) and back toward your spine (retraction). Relax the muscles on the tops of your shoulders and in the back of your neck. Hold for __________ seconds. Slowly release the tension, and relax your muscles completely before you repeat the exercise. Repeat __________ times. Complete this exercise __________ times a day.  Straight leg raises This exercise strengthens the muscles in front of your thigh (quadriceps) and the muscles that move your hips (hip flexors). Lie on your back with your left / right leg extended and your other knee bent. Tense the muscles in the front of your left / right thigh. You should see your kneecap slide up or see increased dimpling just above the knee. Your thigh may even shake a bit. Keep these muscles tight as you raise your leg 4-6 inches (10-15 cm) off the floor. Do not let your knee bend. Hold this position for __________ seconds. Keep these muscles tense as you lower your leg. Relax your muscles slowly and completely after each repetition. Repeat __________ times. Complete this exercise __________ times a day.  Straight leg raises, side-lying This exercise strengthens the muscles that rotate the leg at the hip and move it away from your body (hip abductors). Lie on your side with your left / right leg in the top position. Lie so your head, shoulder, knee, and hip line up. You may bend your bottom knee to help you keep your balance. Roll your hips slightly forward so your hips are stacked directly over each other and your left / right knee is facing forward. Leading with your heel, lift your top leg 4-6  inches (10-15 cm). You should feel the muscles in your outer hip lifting. Do not let your foot drift forward. Do not let your knee roll toward the ceiling. Hold this position for __________ seconds. Slowly return your leg to the starting position. Let your muscles relax completely after each repetition. Repeat __________ times. Complete this exercise __________ times a day.

## 2023-08-14 NOTE — Progress Notes (Signed)
 Office Visit Note  Patient: Connie Castaneda             Date of Birth: 1970-05-24           MRN: [id number]             PCP: Danella Dunn, MD Referring: Stafford Eagles, MD Visit Date: 08/14/2023 Occupation: Retail  Subjective:  New Patient (Initial Visit) (Abnormal labs)   Discussed the use of AI scribe software for clinical note transcription with the patient, who gave verbal consent to proceed.  History of Present Illness   Connie Castaneda is a 53 year old female who presents with abnormal test results, including a positive ANA and elevated uric acid level checked last year associated with joint pain including pain and swelling of the left knee attributed to septic arthritis and out flares of the left foot and possibly in right elbow.  In late January of the previous year, she experienced significant leg swelling, severe pain, and burning, exacerbated by touch, which persisted until March and rendered her unable to walk. After multiple consultations and an emergency room visit, she was diagnosed with a MRSA infection, requiring fluid drainage from the leg. Since treatment, she has not experienced similar swelling or pain, although the affected leg remains larger than the other.  She has a history of arthritis in the knee, noted during the evaluation of her leg swelling. However, since the resolution of the MRSA infection, she has not had significant knee pain.  She has a history of gout, with an episode in the big toe in 2023 and a suspected, though unconfirmed, episode in the elbow post-surgery. She does not take regular medication for gout and has not had a flare-up since the elbow incident. Her uric acid level was slightly elevated at 7.9.  She is currently on blood pressure medication, including amlodipine  and maxzide .  She has a history of obesity and has been involved in a bariatric program, resulting in significant weight loss from 305 pounds to 220 pounds. She is pleased with her  progress.  She notes a habit of scratching her right arm, resulting in bruising, which she attributes to stress. No circulation problems in her hands, such as fingers turning blue or white.      Labs reviewed 10/2022 ANA neg RF neg CCP neg Uric acid 7.9 ESR 11  Activities of Daily Living:  Patient reports morning stiffness for 0 none.   Patient Denies nocturnal pain.  Difficulty dressing/grooming: Denies Difficulty climbing stairs: Denies Difficulty getting out of chair: Denies Difficulty using hands for taps, buttons, cutlery, and/or writing: Denies  Review of Systems  Constitutional:  Negative for fatigue.  HENT:  Negative for mouth sores and mouth dryness.   Eyes:  Negative for dryness.  Respiratory:  Negative for shortness of breath.   Cardiovascular:  Negative for chest pain and palpitations.  Gastrointestinal:  Negative for blood in stool, constipation and diarrhea.  Endocrine: Negative for increased urination.  Genitourinary:  Negative for involuntary urination.  Musculoskeletal:  Negative for joint pain, gait problem, joint pain, joint swelling, myalgias, muscle weakness, morning stiffness, muscle tenderness and myalgias.  Skin:  Negative for color change, rash, hair loss and sensitivity to sunlight.  Allergic/Immunologic: Negative for susceptible to infections.  Neurological:  Positive for dizziness. Negative for headaches.  Hematological:  Negative for swollen glands.  Psychiatric/Behavioral:  Positive for depressed mood. Negative for sleep disturbance. The patient is not nervous/anxious.     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   Positive ANA (antinuclear antibody) 08/14/2023   Morbid obesity with body mass index (BMI) of 40.0 to 44.9 in adult [facility]) 12/06/2022   Effusion of left elbow 06/22/2022   Medication monitoring encounter 06/22/2022   Abscess of left lower extremity 06/04/2022    Past Medical  History:  Diagnosis Date   GERD (gastroesophageal reflux disease)    Hypertension    MRSA (methicillin resistant Staphylococcus aureus)     Family History  Problem Relation Age of Onset   Cancer Mother    Hypertension Mother    Hypertension Father    Gout Father    Cancer Sister    Past Surgical History:  Procedure Laterality Date   CESAREAN SECTION     CHOLECYSTECTOMY     FOOT SURGERY     I & D EXTREMITY Left 06/04/2022   Procedure: IRRIGATION AND DEBRIDEMENT EXTREMITY LEFT CALF ABSCESS, LEFT KNEE ARTHROCENTESIS;  Surgeon: Amada Backer, MD;  Location: [facility];  Service: Orthopedics;  Laterality: Left;   LAPAROSCOPIC GASTRIC SLEEVE RESECTION     LEG SURGERY Left    Social History   Social History Narrative   Not on file    There is no immunization history on file for this patient.   Objective: Vital Signs: BP 126/83 (BP Location: Right Arm, Patient Position: Sitting, Cuff Size: Normal)   Pulse 98   Resp 16   Ht 5\' 6"  (1.676 m)   Wt 226 lb (102.5 kg)   LMP 07/03/2017   BMI 36.48 kg/m    Physical Exam Eyes:     Conjunctiva/sclera: Conjunctivae normal.  Cardiovascular:     Rate and Rhythm: Normal rate and regular rhythm.  Pulmonary:     Effort: Pulmonary effort is normal.     Breath sounds: Normal breath sounds.  Lymphadenopathy:     Cervical: No cervical adenopathy.  Skin:    General: Skin is warm and dry.     Comments: Scattered round excoriated patches and residual hyperpigmentation on arm extensor surfaces Normal appearing nailfold capillaries No digital pitting  Neurological:     Mental Status: She is alert.  Psychiatric:        Mood and Affect: Mood normal.      Musculoskeletal Exam:  Shoulders full ROM no tenderness or swelling Elbows full ROM no tenderness or swelling Wrists full ROM no tenderness or swelling Fingers full ROM no tenderness or swelling No paraspinal tenderness to palpation over upper and lower back Hip normal internal and  external rotation without pain, no tenderness to lateral hip palpation Knees full ROM no tenderness or swelling, faint patellofemoral crepitus    Investigation: No additional findings.  Imaging: No results found.  Recent Labs: Lab Results  Component Value Date   WBC 5.1 06/22/2022   HGB 10.7 (L) 06/22/2022   PLT 211 06/22/2022   NA 139 06/22/2022   K 3.6 06/22/2022   CL 100 06/22/2022   CO2 26 06/22/2022   GLUCOSE 106 (H) 06/22/2022   BUN 11 06/22/2022   CREATININE 0.65 06/22/2022   BILITOT 0.6 06/22/2022   ALKPHOS 83 06/05/2022   AST 13 06/22/2022   ALT 15 06/22/2022   PROT 7.5 06/22/2022   ALBUMIN 2.4 (L) 06/05/2022   CALCIUM 8.8 06/22/2022   GFRAA >60 03/21/2017    Speciality Comments: No specialty comments available.  Procedures:  No procedures performed Allergies: Patient has no known allergies.   Assessment / Plan:  Visit Diagnoses: Positive ANA (antinuclear antibody) - Plan: RNP Antibody, Sjogrens syndrome-A extractable nuclear antibody, Anti-Smith antibody, Anti-DNA antibody, double-stranded, C3 and C4 Positive ANA but was at low titer 1: 80 with DFS pattern which is less associated for autoimmune disease.  No specific clinical criteria noted on history and exam today.  Also with a recent infection treatment at that time.  Will check labs today with more specific markers but lower pretest suspicion for this representing autoimmune connective tissue disease. - Check dsDNA, Smith, SSA, RNP, and complements - If labs positive would still recommend monitoring her symptomatic management for now but with scheduled follow-up  Hyperuricemia - Plan: Uric acid Gout Gout with past episodes in big toe and possibly elbow. Uric acid previously 7.9. Post-menopausal status may increase uric acid. No current flares. Discussed estrogen's role and antihypertensives' impact on uric acid. - Recheck uric acid level. - Provided information on uric acid and gout, including  dietary advice.  Osteoarthritis Mild knee osteoarthritis with crepitus, age-appropriate, not problematic. Weight reduction beneficial. - Provided information on exercises and osteoarthritis management.  Obesity Significant weight loss from 305 lbs to 220 lbs, improving joint health and inflammation. Continued weight management crucial.  Pruritus due to scratching habit Dry skin Pruritus from habitual scratching, possibly stress-related. Skin healing. Discussed strategies to manage itching and prevent skin damage. - Recommend using moisturizing lotion like Cetaphil or CeraVe to alleviate itching.        Orders: Orders Placed This Encounter  Procedures   RNP Antibody   Sjogrens syndrome-A extractable nuclear antibody   Anti-Smith antibody   Anti-DNA antibody, double-stranded   C3 and C4   Uric acid   No orders of the defined types were placed in this encounter.    Follow-Up Instructions: No follow-ups on file.   Matt Song, MD  Note - This record has been created using [employer].  Chart creation errors have been sought, but may not always  have been located. Such creation errors do not reflect on  the standard of medical care.

## 2023-08-16 LAB — ANTI-SMITH ANTIBODY: ENA SM Ab Ser-aCnc: <1 AI

## 2023-08-16 LAB — SJOGRENS SYNDROME-A EXTRACTABLE NUCLEAR ANTIBODY: SSA (Ro) (ENA) Antibody, IgG: <1 AI

## 2023-08-16 LAB — URIC ACID: Uric Acid, Serum: 7.2 mg/dL — ABNORMAL HIGH (ref 2.5–7.0)

## 2023-08-16 LAB — C3 AND C4
C3 Complement: 148 mg/dL (ref 83–193)
C4 Complement: 31 mg/dL (ref 15–57)

## 2023-08-16 LAB — ANTI-DNA ANTIBODY, DOUBLE-STRANDED: ds DNA Ab: <1 [IU]/mL

## 2023-08-16 LAB — RNP ANTIBODY: Ribonucleic Protein(ENA) Antibody, IgG: <1 AI

## 2023-08-17 ENCOUNTER — Other Ambulatory Visit (HOSPITAL_COMMUNITY): Payer: Self-pay

## 2023-08-22 ENCOUNTER — Other Ambulatory Visit: Payer: Self-pay

## 2023-10-02 NOTE — Progress Notes (Signed)
 Medical Visit Program: Surgery  Stage: s/p sleeve  Plan: 3 month follow up, med check    10/02/2023  CC: Connie Castaneda returns to follow up on treatment of excess body weight and associated risk factors/co-morbidities  Date of Surgery: 09/29/2020 Type of Surgery: Sleeve Initial Weight: 302 Last Weight: 243 Todays Weight: 225 Weight Loss Since Last Visit: 18 Total Weight Loss to Date: 49  Interim history: History of Present Illness This is a patient with a history of weight management presenting with significant weight loss.  The patient has experienced a weight loss of approximately 18 pounds since her last visit on 06/2023. She has been on a regimen of Wegovy , initially at 1.7 mg for an additional month due to previous nausea. The dosage was then increased to 2.4 mg, and she reports no current symptoms of nausea or vomiting. She recently underwent laboratory tests and has been consistently taking vitamin D supplements. Her diet includes protein smoothies or chicken sausage for breakfast, and she attempts to incorporate healthy salads into her lunch. She has been focusing on increasing her protein intake, primarily from chicken. She missed a week of medication due to supply issues but has since resumed her treatment. She notes that when she misses a week of medication, she experiences headaches upon resumption, which she believes are side effects of the first dose. However, these symptoms do not occur when she maintains a consistent medication schedule. She has been engaging in physical activity, including walking twice a week (Wednesdays and Fridays) with a friend and climbing two flights of stairs daily.   Problems being monitored/treated and associated ROS:  The following portions of the patient's history were reviewed and updated as appropriate: current medications and problem list.   Vitals BP: 123/73 Heart Rate: 77 SpO2: 97 % Height: 167.6 cm (5' 6) Weight: 102 kg (225 lb)   Wt  Readings from Last 4 Encounters:  10/02/23 102 kg (225 lb)  06/20/23 110 kg (243 lb)  04/18/23 113 kg (249 lb)  02/13/23 117 kg (259 lb)     Exam General: NAD Head: NCAT Eyes: EOMI, PERRL Neck: Supple, trachea mid-line Ext: No cyanosis, clubbing, edema Psych: Alert, oriented; normal behavior, judgement  Plan  1. S/P laparoscopic sleeve gastrectomy      2. Dietary counseling and surveillance      3. BMI 36.0-36.9,adult      4. Essential hypertension      5. Vitamin D deficiency      6. B12 deficiency          New Medications Ordered This Visit  Medications   semaglutide , weight loss, (Wegovy ) 2.4 mg/0.75 mL subcutaneous pen injector    Sig: Inject 0.75 mL (2.4 mg total) under the skin every 7 days.    Dispense:  3 mL    Refill:  3    Medications Discontinued During This Encounter  Medication Reason   semaglutide , weight loss, (Wegovy ) 2.4 mg/0.75 mL subcutaneous pen injector Reorder   Assessment & Plan Initial Assessment: She has lost 18 pounds since the last visit in 06/2023, now weighing 205 pounds. She is tolerating the increased dosage of Wegovy  2.4 mg well, with no ongoing nausea or vomiting.  Visit Course: - Blood pressure checked, noted to be in a good range. - Labs for vitamin D and B12 levels obtained.  Final Assessment: Weight management is progressing well with significant weight loss and no adverse effects from the increased Wegovy  dosage. Blood pressure is stable. Labs for vitamin D  and B12 levels were obtained to monitor for improvement in deficiencies since she began supplementation.  Clinical Impression: - Weight management - Vitamin D deficiency - Vitamin B12 deficiency  Disposition: - Continue current regimen of Wegovy , dietary habits, and physical activities. - Follow-Up: Follow up in 4 months.      I have personally spent 32 minutes involved in face-to-face and non-face-to-face activities for this patient on the day of the visit.   Professional time spent includes the following activities, in addition to those noted in the documentation: preparing to see the patient by review of history and previous tests; counseling and educating the patient; ordering medications, tests, or procedures; referring andcommunicating with other health care professionals; and documenting clinical information in the health record.

## 2023-10-25 ENCOUNTER — Other Ambulatory Visit (HOSPITAL_COMMUNITY): Payer: Self-pay

## 2024-01-01 ENCOUNTER — Other Ambulatory Visit (HOSPITAL_COMMUNITY): Payer: Self-pay

## 2024-02-13 NOTE — Progress Notes (Signed)
 Medical Visit Program: Surgery  Stage: s/p sleeve 2022 Plan: 6 month follow up    02/13/2024  CC: Connie Castaneda returns to follow up on treatment of excess body weight and associated risk factors/co-morbidities  Date of Surgery: 09/29/2020 Type of Surgery: Sleeve Initial Weight: 302 Last Weight: 225 Todays Weight: 223  Weight Loss Since Last Visit: 2 Total Weight Loss to Date: 32  Interim history: History of Present Illness This is a patient with a history of weight management presenting with concerns about weight gain.  The patient reports a recent increase in weight, which she attributes to the discontinuation of her Wegovy  injections approximately 3 weeks ago. She had reached a low weight of 214 lbs. Current weight today is 223 lbs. Her insurance stopped coverage on 01/02/2025. She has been experiencing heightened hunger and cravings over the past few weeks. Her dietary regimen includes Premier protein shakes with 30 g of protein and an increased intake of water. She has not previously tried phentermine or Topamax. She also reports constipation since stopping the injections. She is considering the use of probiotics. She has not been taking Vit D or vitamin B12 supplements consistently.   She has no history of cardiac arrhythmias, palpitations, or heart disease. She was referred to a cardiologist by PCP for a heart monitor due to dizziness and lightheadedness, but has not yet received a call for an appointment. She underwent lab tests to rule out diabetes and was referred to a rheumatologist for leg issues, which were initially suspected to be lupus but were later ruled out.    Problems being monitored/treated and associated ROS:  The following portions of the patient's history were reviewed and updated as appropriate: current medications and problem list.   Vitals BP: 123/69 Heart Rate: 94 SpO2: 98 % Height: 167.6 cm (5' 6) Weight: 101 kg (223 lb)   Wt Readings from Last 4 Encounters:   02/13/24 101 kg (223 lb)  10/02/23 102 kg (225 lb)  06/20/23 110 kg (243 lb)  04/18/23 113 kg (249 lb)     Exam General: NAD Head: NCAT Eyes: EOMI, PERRL Neck: Supple, trachea mid-line Ext: No cyanosis, clubbing, edema Psych: Alert, oriented; normal behavior, judgement  Plan  1. S/P laparoscopic sleeve gastrectomy  phentermine 15 mg cap    2. Dietary counseling and surveillance      3. Vitamin D deficiency      4. B12 deficiency      5. Obesity, Class II, BMI 35-39.9  phentermine 15 mg cap        New Medications Ordered This Visit  Medications   phentermine 15 mg cap    Sig: Take 1 capsule (15 mg total) by mouth daily.    Dispense:  30 capsule    Refill:  1   ergocalciferol (VITAMIN D2) 1,250 mcg (50,000 unit) capsule    Sig: Take 1 capsule (50,000 Units total) by mouth 2 (two) times weekly.    Dispense:  24 capsule    Refill:  0    Medications Discontinued During This Encounter  Medication Reason   semaglutide , weight loss, (Wegovy ) 2.4 mg/0.75 mL subcutaneous pen injector      Assessment & Plan Initial Assessment: Blood pressure well-regulated. No history of cardiac arrhythmias, palpitations, or heart disease. Increased food intake and weight gain after discontinuing weight loss injection (wegovy ).  Differential Diagnosis: - Dizziness: Reported back in June. No chest pain. Advised to follow up with primary care provider for cardiologist referral. - Constipation: Reported since  discontinuing weight loss injection. Stool softeners or MiraLAX  recommended. Probiotic supplement advised.  Final Assessment: Blood pressure well-regulated. No history of cardiac arrhythmias, palpitations, or heart disease. Increased food intake and weight gain after discontinuing weight loss injection. Phentermine prescribed for appetite suppression. Stool softeners or MiraLAX  recommended for constipation. Probiotic supplement advised. Follow-up with primary care provider for  cardiologist referral due to dizziness.  Clinical Impression: - Weight management - Constipation - Dizziness  Follow-Up: - Follow up with primary care provider for cardiologist referral. - Recheck vitamin D and B12 levels in 04/2024.  Patient Education: Potential side effects of phentermine discussed. Advised to take phentermine in the morning. Stool softeners or MiraLAX  recommended for constipation. Probiotic supplement advised. Resume prescription Vit D and daily B12.      I have personally spent 30 minutes involved in face-to-face and non-face-to-face activities for this patient on the day of the visit.  Professional time spent includes the following activities, in addition to those noted in the documentation: preparing to see the patient by review of history and previous tests; counseling and educating the patient; ordering medications, tests, or procedures; referring andcommunicating with other health care professionals; and documenting clinical information in the health record.

## 2024-04-02 ENCOUNTER — Ambulatory Visit
Admission: RE | Admit: 2024-04-02 | Discharge: 2024-04-02 | Disposition: A | Discharge status: Home / Self Care | Payer: Self-pay | Source: Ambulatory Visit | Attending: Nurse Practitioner | Admitting: Nurse Practitioner

## 2024-04-02 ENCOUNTER — Other Ambulatory Visit: Payer: Self-pay

## 2024-04-02 VITALS — BP 157/91 | HR 80 | Temp 97.7°F | Resp 18

## 2024-04-02 DIAGNOSIS — N898 Other specified noninflammatory disorders of vagina: Secondary | ICD-10-CM | POA: Diagnosis not present

## 2024-04-02 DIAGNOSIS — R030 Elevated blood-pressure reading, without diagnosis of hypertension: Secondary | ICD-10-CM | POA: Diagnosis not present

## 2024-04-02 LAB — POCT URINE DIPSTICK
Bilirubin, UA: NEGATIVE
Blood, UA: NEGATIVE
Glucose, UA: NEGATIVE mg/dL
Ketones, POC UA: NEGATIVE mg/dL
Nitrite, UA: NEGATIVE
POC PROTEIN,UA: NEGATIVE
Spec Grav, UA: 1.02
Urobilinogen, UA: 0.2 U/dL
pH, UA: 6

## 2024-04-02 LAB — CERVICOVAGINAL ANCILLARY ONLY
Bacterial Vaginitis (gardnerella): NEGATIVE
Candida Glabrata: NEGATIVE
Candida Vaginitis: NEGATIVE
Comment: NEGATIVE
Comment: NEGATIVE
Comment: NEGATIVE

## 2024-04-02 NOTE — ED Triage Notes (Signed)
 Pt here for vaginal itching x 6 weeks with some white discharge; pt sts seen PCP without resolution; pt sts some burning with urination at times

## 2024-04-02 NOTE — ED Provider Notes (Signed)
 " [facility]    CSN: [id number] Arrival date & time: 04/02/24  9144      History   Chief Complaint Chief Complaint  Patient presents with   Vaginal Itching    Entered by patient    HPI Connie Castaneda is a 53 y.o. female.   Patient presents with ongoing vaginal itching with white discharge - onset of symptoms was approximately 6 weeks ago.  Denies changes in lotions, soaps, detergents, or foods.  Is not currently sexually active - last activity in February/March of this year.  Denies concerns for STIs.  Has already been evaluated by her PCP for this concern - was given a topical cream (unknown name) and prednisone  (which patient completed).  She has not noted any improvement in those symptoms.  There is some mild dysuria but patient feels that is associated with the skin irritation.  Uses Dove soaps.  No reported rash in the area of concern.  States there is a 'bump' in her perineal region but believes that was present before these symptoms started and her PCP already did a physical examination of that area.  The history is provided by the patient.  Vaginal Itching Pertinent negatives include no abdominal pain, no headaches and no shortness of breath.    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/14/2023   Positive ANA (antinuclear antibody) 08/14/2023   Morbid obesity with body mass index (BMI) of 40.0 to 44.9 in adult [facility]) 12/06/2022   Effusion of left elbow 06/22/2022   Medication monitoring encounter 06/22/2022   Abscess of left lower extremity 06/04/2022    Past Surgical History:  Procedure Laterality Date   CESAREAN SECTION     CHOLECYSTECTOMY     FOOT SURGERY     I & D EXTREMITY Left 06/04/2022   Procedure: IRRIGATION AND DEBRIDEMENT EXTREMITY LEFT CALF ABSCESS, LEFT KNEE  ARTHROCENTESIS;  Surgeon: Kit Rush, MD;  Location: [facility];  Service: Orthopedics;  Laterality: Left;   LAPAROSCOPIC GASTRIC SLEEVE RESECTION     LEG SURGERY Left     OB History   No obstetric history on file.      Home Medications    Prior to Admission medications  Medication Sig Start Date End Date Taking? Authorizing Provider  acetaminophen  (TYLENOL ) 325 MG tablet Take 650 mg by mouth every 6 (six) hours as needed.    [provider]  amLODipine  (NORVASC ) 2.5 MG tablet Take by mouth.    [provider]  benzonatate  (TESSALON  PERLES) 100 MG capsule Take 2 capsules (200 mg total) by mouth 3 (three) times daily as needed for cough. 07/17/23   Arloa Suzen RAMAN, NP  ciprofloxacin (CIPRO) 250 MG tablet Take 250 mg by mouth daily. Patient not taking: Reported on 08/14/2023 06/09/23   [provider]  colchicine  0.6 MG tablet Take 0.6 mg by mouth daily as needed. 05/13/22   [provider]  docusate sodium  (COLACE) 100 MG capsule Take 1 capsule (100 mg total) by mouth 2 (two) times daily. While taking narcotic pain medicine. Patient not taking: Reported on 08/14/2023 06/06/22   Aniceto Eva Grebe, PA-C  fluconazole  (DIFLUCAN ) 100 MG tablet Take 100 mg by mouth daily. Patient not taking: Reported on 08/14/2023 05/04/23   [provider]  levocetirizine (XYZAL ) 5 MG tablet Take  1 tablet (5 mg total) by mouth every evening. 07/17/23   Arloa Suzen RAMAN, NP  methocarbamol  (ROBAXIN ) 500 MG tablet Take 1 tablet (500 mg total) by mouth 2 (two) times daily. Patient not taking: Reported on 08/14/2023 08/12/22   Dasie Faden, MD  metoprolol  tartrate (LOPRESSOR ) 50 MG tablet Take 50 mg by mouth daily. Patient not taking: Reported on 08/14/2023 04/20/22   [provider]  omeprazole  (PRILOSEC) 40 MG capsule Take 1 capsule (40 mg total) by mouth 2 (two) times daily. 01/25/22   Christopher Savannah, PA-C  ondansetron  (ZOFRAN -ODT) 4 MG disintegrating tablet Take 1  tablet (4 mg total) by mouth every 8 (eight) hours as needed. Patient not taking: Reported on 08/14/2023 05/28/22   Long, Fonda MATSU, MD  predniSONE  (DELTASONE ) 20 MG tablet Take 1 tablet (20 mg total) by mouth 2 (two) times daily with a meal. Patient not taking: Reported on 08/14/2023 07/17/23   Arloa Suzen RAMAN, NP  triamterene -hydrochlorothiazide  (MAXZIDE -25) 37.5-25 MG tablet Take 1 tablet by mouth daily. 10/24/22   [provider]  Vitamin D, Ergocalciferol, (DRISDOL) 1.25 MG (50000 UNIT) CAPS capsule Take by mouth. 04/20/23 04/19/24  [provider]    Family History Family History  Problem Relation Age of Onset   Cancer Mother    Hypertension Mother    Hypertension Father    Gout Father    Cancer Sister     Social History Social History[1]   Allergies   Patient has no known allergies.   Review of Systems Review of Systems  Constitutional:  Negative for chills and fatigue.  HENT:  Negative for congestion, rhinorrhea and sore throat.   Eyes:  Negative for pain and redness.  Respiratory:  Negative for cough and shortness of breath.   Gastrointestinal:  Negative for abdominal pain, diarrhea and vomiting.  Genitourinary:  Positive for dysuria, vaginal discharge and vaginal pain. Negative for pelvic pain.  Musculoskeletal:  Negative for back pain and myalgias.  Skin:  Negative for rash.  Neurological:  Negative for dizziness and headaches.  Psychiatric/Behavioral:  Negative for sleep disturbance.      Physical Exam Triage Vital Signs ED Triage Vitals  Encounter Vitals Group     BP 04/02/24 0907 (!) 157/91     Girls Systolic BP Percentile --      Girls Diastolic BP Percentile --      Boys Systolic BP Percentile --      Boys Diastolic BP Percentile --      Pulse Rate 04/02/24 0907 80     Resp 04/02/24 0907 18     Temp 04/02/24 0907 97.7 F (36.5 C)     Temp Source 04/02/24 0907 Oral     SpO2 04/02/24 0907 98 %     Weight --      Height --      Head  Circumference --      Peak Flow --      Pain Score 04/02/24 0908 0     Pain Loc --      Pain Education --      Exclude from Growth Chart --    No data found.  Updated Vital Signs BP (!) 157/91 (BP Location: Left Arm)   Pulse 80   Temp 97.7 F (36.5 C) (Oral)   Resp 18   LMP 07/03/2017   SpO2 98%   Physical Exam Vitals and nursing note reviewed.  Constitutional:      Appearance: Normal appearance.  HENT:  Head: Normocephalic.  Cardiovascular:     Rate and Rhythm: Normal rate and regular rhythm.     Heart sounds: Normal heart sounds.  Pulmonary:     Effort: Pulmonary effort is normal.     Breath sounds: Normal breath sounds. No wheezing, rhonchi or rales.  Abdominal:     General: Bowel sounds are normal.  Skin:    General: Skin is warm and dry.  Neurological:     General: No focal deficit present.     Mental Status: She is alert and oriented to person, place, and time.  Psychiatric:        Mood and Affect: Mood normal.        Behavior: Behavior normal.        Thought Content: Thought content normal.        Judgment: Judgment normal.      UC Treatments / Results  Labs (all labs ordered are listed, but only abnormal results are displayed) Labs Reviewed  POCT URINE DIPSTICK - Abnormal; Notable for the following components:      Result Value   Clarity, UA hazy (*)    Leukocytes, UA Trace (*)    All other components within normal limits  CERVICOVAGINAL ANCILLARY ONLY    EKG   Radiology No results found.  Procedures Procedures (including critical care time)  Medications Ordered in UC Medications - No data to display  Initial Impression / Assessment and Plan / UC Course  I have reviewed the triage vital signs and the nursing notes.  Pertinent labs & imaging results that were available during my care of the patient were reviewed by me and considered in my medical decision making (see chart for details).    Patient presents for evaluation of ongoing  vaginal itching and discharge x 6 weeks.  Already treated with prednisone  and a topical cream.  Not currently using any OTC interventions.  Not currently sexually active.  Already evaluated by her PCP for this concern.  Will plan for a vaginitis screen including bacterial vaginosis and yeast.  Discussed medications will be prescribed as needed based upon those results.  No indication of UTI today.  Follow up with her PCP if symptoms persist or worsen.    Final Clinical Impressions(s) / UC Diagnoses   Final diagnoses:  Vaginal itching  Vaginal discharge  Elevated blood pressure reading     Discharge Instructions      Pending vaginal swab for yeast and bacterial vaginosis. Will provide medications if needed based on those results. Your blood pressure is elevated today.  Please recheck at home and follow up with your doctor if it remains > 140/90.     ED Prescriptions   None    PDMP not reviewed this encounter.    [1]  Social History Tobacco Use   Smoking status: Never    Passive exposure: Never   Smokeless tobacco: Never  Vaping Use   Vaping status: Never Used  Substance Use Topics   Alcohol use: No   Drug use: No     Janet Therisa PARAS, FNP 04/02/24 [phone]  "

## 2024-04-02 NOTE — Discharge Instructions (Addendum)
 Pending vaginal swab for yeast and bacterial vaginosis. Will provide medications if needed based on those results. Your blood pressure is elevated today.  Please recheck at home and follow up with your doctor if it remains > 140/90.

## 2024-05-17 ENCOUNTER — Encounter: Admitting: Obstetrics and Gynecology
